# Patient Record
Sex: Male | Born: 1975 | Race: White | Hispanic: No | Marital: Single | Smoking: Current every day smoker
Health system: Southern US, Community
[De-identification: ages and names within clinical notes are randomized; demographics above are authoritative.]

## PROBLEM LIST (undated history)

## (undated) DIAGNOSIS — I10 Essential (primary) hypertension: Secondary | ICD-10-CM

## (undated) DIAGNOSIS — I471 Supraventricular tachycardia, unspecified: Secondary | ICD-10-CM

## (undated) DIAGNOSIS — R569 Unspecified convulsions: Secondary | ICD-10-CM

## (undated) DIAGNOSIS — R51 Headache: Secondary | ICD-10-CM

## (undated) HISTORY — PX: OTHER SURGICAL HISTORY: SHX169

## (undated) HISTORY — PX: ANKLE SURGERY: SHX546

---

## 1998-11-08 ENCOUNTER — Encounter: Payer: Self-pay | Admitting: Orthopedic Surgery

## 1998-11-08 ENCOUNTER — Emergency Department (HOSPITAL_COMMUNITY): Admission: EM | Admit: 1998-11-08 | Discharge: 1998-11-08 | Payer: Self-pay

## 1998-11-08 ENCOUNTER — Inpatient Hospital Stay (HOSPITAL_COMMUNITY): Admission: EM | Admit: 1998-11-08 | Discharge: 1998-11-09 | Payer: Self-pay | Admitting: Orthopedic Surgery

## 2002-07-30 ENCOUNTER — Inpatient Hospital Stay (HOSPITAL_COMMUNITY): Admission: EM | Admit: 2002-07-30 | Discharge: 2002-07-31 | Payer: Self-pay | Admitting: Emergency Medicine

## 2002-07-30 ENCOUNTER — Encounter: Payer: Self-pay | Admitting: Orthopedic Surgery

## 2002-07-30 ENCOUNTER — Encounter: Payer: Self-pay | Admitting: Emergency Medicine

## 2002-10-02 ENCOUNTER — Encounter: Payer: Self-pay | Admitting: Emergency Medicine

## 2002-10-02 ENCOUNTER — Emergency Department (HOSPITAL_COMMUNITY): Admission: EM | Admit: 2002-10-02 | Discharge: 2002-10-02 | Payer: Self-pay | Admitting: Emergency Medicine

## 2002-10-09 ENCOUNTER — Emergency Department (HOSPITAL_COMMUNITY): Admission: EM | Admit: 2002-10-09 | Discharge: 2002-10-09 | Payer: Self-pay | Admitting: Emergency Medicine

## 2003-09-08 ENCOUNTER — Emergency Department (HOSPITAL_COMMUNITY): Admission: EM | Admit: 2003-09-08 | Discharge: 2003-09-08 | Payer: Self-pay | Admitting: Emergency Medicine

## 2003-10-04 ENCOUNTER — Emergency Department (HOSPITAL_COMMUNITY): Admission: EM | Admit: 2003-10-04 | Discharge: 2003-10-04 | Payer: Self-pay | Admitting: Emergency Medicine

## 2003-10-08 ENCOUNTER — Emergency Department (HOSPITAL_COMMUNITY): Admission: EM | Admit: 2003-10-08 | Discharge: 2003-10-08 | Payer: Self-pay | Admitting: Emergency Medicine

## 2003-10-16 ENCOUNTER — Ambulatory Visit (HOSPITAL_BASED_OUTPATIENT_CLINIC_OR_DEPARTMENT_OTHER): Admission: RE | Admit: 2003-10-16 | Discharge: 2003-10-16 | Payer: Self-pay | Admitting: Otolaryngology

## 2004-03-15 ENCOUNTER — Emergency Department (HOSPITAL_COMMUNITY): Admission: EM | Admit: 2004-03-15 | Discharge: 2004-03-15 | Payer: Self-pay | Admitting: Emergency Medicine

## 2004-07-31 ENCOUNTER — Emergency Department (HOSPITAL_COMMUNITY): Admission: EM | Admit: 2004-07-31 | Discharge: 2004-07-31 | Payer: Self-pay | Admitting: Emergency Medicine

## 2004-08-08 ENCOUNTER — Emergency Department (HOSPITAL_COMMUNITY): Admission: EM | Admit: 2004-08-08 | Discharge: 2004-08-08 | Payer: Self-pay | Admitting: Emergency Medicine

## 2004-11-24 ENCOUNTER — Inpatient Hospital Stay (HOSPITAL_COMMUNITY): Admission: EM | Admit: 2004-11-24 | Discharge: 2004-11-26 | Payer: Self-pay | Admitting: Emergency Medicine

## 2004-11-24 ENCOUNTER — Ambulatory Visit: Payer: Self-pay | Admitting: Internal Medicine

## 2006-06-11 ENCOUNTER — Emergency Department (HOSPITAL_COMMUNITY): Admission: EM | Admit: 2006-06-11 | Discharge: 2006-06-11 | Payer: Self-pay | Admitting: Emergency Medicine

## 2006-06-18 ENCOUNTER — Emergency Department (HOSPITAL_COMMUNITY): Admission: EM | Admit: 2006-06-18 | Discharge: 2006-06-18 | Payer: Self-pay | Admitting: Emergency Medicine

## 2007-03-24 ENCOUNTER — Emergency Department (HOSPITAL_COMMUNITY): Admission: EM | Admit: 2007-03-24 | Discharge: 2007-03-24 | Payer: Self-pay | Admitting: Emergency Medicine

## 2007-12-04 ENCOUNTER — Emergency Department (HOSPITAL_COMMUNITY): Admission: EM | Admit: 2007-12-04 | Discharge: 2007-12-04 | Payer: Self-pay | Admitting: Emergency Medicine

## 2008-05-23 ENCOUNTER — Emergency Department (HOSPITAL_COMMUNITY): Admission: EM | Admit: 2008-05-23 | Discharge: 2008-05-23 | Payer: Self-pay | Admitting: Emergency Medicine

## 2011-03-28 NOTE — H&P (Signed)
   NAME:  KORDELL, JAFRI                           ACCOUNT NO.:  [id number]   MEDICAL RECORD NO.:  [id number]                   PATIENT TYPE:  INP   LOCATION:  5023                                 FACILITY:  [facility]   PHYSICIAN:  Kennieth Rad, M.D.              DATE OF BIRTH:  02-17-1976   DATE OF ADMISSION:  07/30/2002  DATE OF DISCHARGE:  07/31/2002                                HISTORY & PHYSICAL   CHIEF COMPLAINT:  Pain from deformed left ankle.   HISTORY OF PRESENT ILLNESS:  This is a 34 year old who states that he made a  miss step to his left heel and his foot and ankle rolled out from under him  causing him to fall.  The patient complained of severe pain and deformity of  the left ankle and was brought to the emergency room by his stepfather.  The  patient denies hitting his head or loss of consciousness.   PAST MEDICAL HISTORY:  Right knee surgery.  No history of high blood  pressure or diabetes.   ALLERGIES:  NONE.   MEDICATIONS:  None.   SOCIAL HISTORY:  The patient lives alone.   HABITS:  The patient smokes one pack per day and drinks beer on the  weekends.   REVIEW OF SYSTEMS:  No cardiac, respiratory, urine or bowel symptoms.   PHYSICAL EXAMINATION:  VITAL SIGNS:  Temperature 97.4, pulse 90,  respirations 18, blood pressure 130/80.  GENERAL:  Alert and oriented, alcohol on his breath.  The patient states he  has had 5 beers tonight.  HEAD:  Normocephalic.  Eyes, conjunctivae injected.  NECK:  Supple.  CHEST:  Clear.  CARDIAC:  S1 _________.  ABDOMEN:  Soft, active bowel sounds.  EXTREMITIES:  Left ankle diffusely swollen, angulated, unstable, tender  medially and laterally,dorsalis pedis intact, pulses intact.   X-ray reveals bimalleolar fracture of left ankle with displacement.   IMPRESSION:  Bimalleolar fracture, left ankle.                                               Kennieth Rad, M.D.    AFC/MEDQ  D:  07/30/2002  T:  08/02/2002  Job:   [id number]
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
## 2011-03-28 NOTE — Op Note (Signed)
   NAME:  Dalton [name], Dalton [name]                           ACCOUNT NO.:  [id number]   MEDICAL RECORD NO.:  [id number]                   PATIENT TYPE:  INP   LOCATION:  5023                                 FACILITY:  [facility]   PHYSICIAN:  Kennieth Rad, M.D.              DATE OF BIRTH:   DATE OF PROCEDURE:  07/30/2002  DATE OF DISCHARGE:  07/31/2002                                 OPERATIVE REPORT   PREOPERATIVE DIAGNOSIS:  Bimalleolar fracture, left ankle.   POSTOPERATIVE DIAGNOSIS:  Bimalleolar fracture, left ankle.   PROCEDURE:  Open reduction and internal fixation of bimalleolar fracture  left ankle.   SURGEON:  Kennieth Rad, M.D.   ANESTHESIA:  General.   DESCRIPTION OF PROCEDURE:  The patient was taken to the operating room after  giving adequate preop medication, general anesthesia and intubated.  The  left lower leg was prepped with Duraprep and draped in a sterile manner.  A  tourniquet and Bovie used for hemostasis.   An incision was made over the lateral aspect of the left ankle going through  the skin and subcutaneous tissue down to the fracture site.  Manipulated  reduction of the lateral malleolar fracture was done with good alignment and  was stabilized with a six-hole plate.  Copious irrigation was then done  followed by wound closure with the use of #0 Vicryl followed by skin staples  in the skin.   A medial Kocher incision made along the medial aspect of the ankle going  through the skin and subcutaneous tissue down to the fracture site.  Irrigation of the fracture site was done.  Curetment about the edges.  The  edges of the medial malleolar fracture was reduced anatomically and a  cannulated screw placed across it holding it in good stable position.  Wound  closure was then done with #0 Vicryl for the fascia, 2-0 for the  subcutaneous, skin staples for the skin.  Compressive dressing was applied  and a short leg cast applied.   The patient tolerated the  procedure quite well and was taken to the recovery  room in stable and satisfactory condition.                                               Kennieth Rad, M.D.    AFC/MEDQ  D:  07/30/2002  T:  08/02/2002  Job:  [id number]

## 2011-07-06 ENCOUNTER — Emergency Department (HOSPITAL_COMMUNITY)
Admission: EM | Admit: 2011-07-06 | Discharge: 2011-07-06 | Disposition: A | Discharge status: Home / Self Care | Payer: Self-pay | Attending: Emergency Medicine | Admitting: Emergency Medicine

## 2011-07-06 DIAGNOSIS — R221 Localized swelling, mass and lump, neck: Secondary | ICD-10-CM | POA: Insufficient documentation

## 2011-07-06 DIAGNOSIS — R220 Localized swelling, mass and lump, head: Secondary | ICD-10-CM | POA: Insufficient documentation

## 2011-07-06 DIAGNOSIS — R599 Enlarged lymph nodes, unspecified: Secondary | ICD-10-CM | POA: Insufficient documentation

## 2011-07-12 DEATH — deceased

## 2011-10-14 ENCOUNTER — Encounter: Payer: Self-pay | Admitting: *Deleted

## 2011-10-14 ENCOUNTER — Emergency Department (HOSPITAL_COMMUNITY)
Admission: EM | Admit: 2011-10-14 | Discharge: 2011-10-14 | Disposition: A | Discharge status: Home / Self Care | Payer: Self-pay | Attending: Emergency Medicine | Admitting: Emergency Medicine

## 2011-10-14 DIAGNOSIS — R221 Localized swelling, mass and lump, neck: Secondary | ICD-10-CM | POA: Insufficient documentation

## 2011-10-14 DIAGNOSIS — K1120 Sialoadenitis, unspecified: Secondary | ICD-10-CM | POA: Insufficient documentation

## 2011-10-14 DIAGNOSIS — R220 Localized swelling, mass and lump, head: Secondary | ICD-10-CM | POA: Insufficient documentation

## 2011-10-14 DIAGNOSIS — F17200 Nicotine dependence, unspecified, uncomplicated: Secondary | ICD-10-CM | POA: Insufficient documentation

## 2011-10-14 HISTORY — DX: Unspecified convulsions: R56.9

## 2011-10-14 MED ORDER — HYDROCODONE-ACETAMINOPHEN 5-325 MG PO TABS: 2.0000 | ORAL_TABLET | Freq: Four times a day (QID) | ORAL | Status: AC | PRN

## 2011-10-14 MED ORDER — CLINDAMYCIN HCL 150 MG PO CAPS: 300.0000 mg | ORAL_CAPSULE | Freq: Four times a day (QID) | ORAL | Status: AC

## 2011-10-14 NOTE — ED Notes (Signed)
Pt states "have been seen here before for the same, my face swells, hurts to eat, they say that maybe I cut m;yself shaving & it caused the infection"

## 2011-10-14 NOTE — ED Provider Notes (Signed)
History     CSN: [id number] Arrival date & time: 10/14/2011  4:05 PM   First MD Initiated Contact with Patient 10/14/11 1720      Chief Complaint  Patient presents with  . Facial Swelling    (Consider location/radiation/quality/duration/timing/severity/associated sxs/prior treatment) The history is provided by the patient.  Pt complains of swelling of the L lower jaw for the past 2 days.  Pt denies fever.  Pain is worse following eating, pt notes increased swelling following eating as well.  Pt denies dental pain, ear pain.  Pt notes abnormal taste in the mouth.  Pt notes similar symptoms 2 months ago.   Past Medical History  Diagnosis Date  . Seizures     Past Surgical History  Procedure Date  . Right knee     plate with screws  . Ankle surgery     left with screws    No family history on file.  History  Substance Use Topics  . Smoking status: Current Everyday Smoker -- 1.0 packs/day  . Smokeless tobacco: Not on file  . Alcohol Use: Yes     ocassioinally      Review of Systems  HENT: Negative for congestion, neck pain and neck stiffness.   Musculoskeletal: Negative for myalgias.  All other systems reviewed and are negative.    Allergies  Review of patient's allergies indicates no known allergies.  Home Medications  No current outpatient prescriptions on file.  BP 137/101  Pulse 107  Temp(Src) 99 F (37.2 C) (Oral)  Resp 18  Wt 250 lb (113.399 kg)  SpO2 99%  Physical Exam  Constitutional: He is oriented to person, place, and time. He appears well-developed and well-nourished.  HENT:  Head: Normocephalic and atraumatic.  Right Ear: External ear normal.  Left Ear: External ear normal.  Mouth/Throat: Oropharynx is clear and moist.       L submandibular swelling and tenderness, no erythema or warmth.  No dental tenderness, gingival swelling or fluctuance.    Eyes: Pupils are equal, round, and reactive to light.  Neck: No tracheal deviation present.  No thyromegaly present.  Pulmonary/Chest: Effort normal.  Musculoskeletal: Normal range of motion.  Lymphadenopathy:    He has no cervical adenopathy.  Neurological: He is alert and oriented to person, place, and time.  Skin: Skin is warm and dry.    ED Course  Procedures (including critical care time)  Labs Reviewed - No data to display No results found.   No diagnosis found.    MDM  Will treat as sialandenitis, ENT follow up.          Achilles Dunk Minto, [place] 10/14/11 [phone]

## 2011-10-15 ENCOUNTER — Emergency Department (HOSPITAL_COMMUNITY)
Admission: EM | Admit: 2011-10-15 | Discharge: 2011-10-16 | Disposition: A | Discharge status: Home / Self Care | Payer: Self-pay | Attending: Emergency Medicine | Admitting: Emergency Medicine

## 2011-10-15 ENCOUNTER — Encounter (HOSPITAL_COMMUNITY): Payer: Self-pay | Admitting: Emergency Medicine

## 2011-10-15 DIAGNOSIS — F17200 Nicotine dependence, unspecified, uncomplicated: Secondary | ICD-10-CM | POA: Insufficient documentation

## 2011-10-15 DIAGNOSIS — R220 Localized swelling, mass and lump, head: Secondary | ICD-10-CM | POA: Insufficient documentation

## 2011-10-15 DIAGNOSIS — R0602 Shortness of breath: Secondary | ICD-10-CM | POA: Insufficient documentation

## 2011-10-15 DIAGNOSIS — K115 Sialolithiasis: Secondary | ICD-10-CM | POA: Insufficient documentation

## 2011-10-15 DIAGNOSIS — R221 Localized swelling, mass and lump, neck: Secondary | ICD-10-CM | POA: Insufficient documentation

## 2011-10-15 NOTE — ED Notes (Signed)
Pt alert, nad, seen in ED yesterday c/o sore throat, has f/u with ENT in am, returns with cont c/o. resp even unlabored, skin pwd, no stridor noted, shows no s/s of distress, tolerating oral secretions well

## 2011-10-15 NOTE — ED Provider Notes (Signed)
Medical screening examination/treatment/procedure(s) were performed by non-physician practitioner and as supervising physician I was immediately available for consultation/collaboration.  Raeford Razor, MD 10/15/11 [phone]

## 2011-10-16 LAB — DIFFERENTIAL
Basophils Relative: 0 % (ref 0–1)
Eosinophils Absolute: 0.2 10*3/uL (ref 0.0–0.7)
Monocytes Absolute: 0.8 10*3/uL (ref 0.1–1.0)
Monocytes Relative: 8 % (ref 3–12)
Neutro Abs: 6.2 10*3/uL (ref 1.7–7.7)
Neutrophils Relative %: 60 % (ref 43–77)

## 2011-10-16 LAB — CBC
Hemoglobin: 16.6 g/dL (ref 13.0–17.0)
MCH: 32.3 pg (ref 26.0–34.0)
MCV: 91.2 fL (ref 78.0–100.0)
Platelets: 239 10*3/uL (ref 150–400)
RDW: 12.9 % (ref 11.5–15.5)
WBC: 10.3 10*3/uL (ref 4.0–10.5)

## 2011-10-16 LAB — AMYLASE: Amylase: 135 U/L — ABNORMAL HIGH (ref 0–105)

## 2011-10-16 MED ORDER — CLINDAMYCIN HCL 300 MG PO CAPS
300.0000 mg | ORAL_CAPSULE | Freq: Once | ORAL | Status: AC
  Administered 2011-10-16: 300 mg via ORAL
  Filled 2011-10-16: qty 1

## 2011-10-16 MED ORDER — FENTANYL CITRATE 0.05 MG/ML IJ SOLN
50.0000 ug | Freq: Once | INTRAMUSCULAR | Status: AC
Start: 2011-10-16 — End: 2011-10-16
  Administered 2011-10-16: 50 ug via INTRAVENOUS
  Filled 2011-10-16: qty 2

## 2011-10-16 MED ORDER — SODIUM CHLORIDE 0.9 % IV BOLUS (SEPSIS)
500.0000 mL | Freq: Once | INTRAVENOUS | Status: AC
  Administered 2011-10-16: 1000 mL via INTRAVENOUS

## 2011-10-16 NOTE — ED Notes (Signed)
Pt okay. Food and antibiotic given. Awaiting d/c instructions

## 2011-10-16 NOTE — ED Notes (Signed)
Pt d/c stable. Ambulatory.

## 2011-10-16 NOTE — ED Notes (Signed)
Pt here with c/o sore throat and difficulty swallowing. Pt was seen here yesterday and was prescribed antibiotics and pain medication. Today, he says he feels it worsened and swallowing is more difficult. Rates throat pain 5/10 at this time.

## 2011-10-16 NOTE — ED Provider Notes (Signed)
Medical screening examination/treatment/procedure(s) were performed by non-physician practitioner and as supervising physician I was immediately available for consultation/collaboration.  Zakayla Martinec M Rhylie Stehr, MD 10/16/11 0717 

## 2011-10-16 NOTE — ED Provider Notes (Signed)
History     CSN: [id number] Arrival date & time: 10/15/2011 11:21 PM   First MD Initiated Contact with Patient 10/16/11 0003      Chief Complaint  Patient presents with  . Oral Swelling    Blocked Salivary Gland    (Consider location/radiation/quality/duration/timing/severity/associated sxs/prior treatment) HPI Comments: Patient was diagnosed 2 years ago with a salivary duct stone.  Prescribed clindamycin and hydrocodone, which he, states he's been taking, but finds that the swelling has become worse and he is having increased pain after eating.  He does have an appointment with Dr. Ezzard Standing tomorrow.  No labs were done in 2 days ago for diagnosis.  Will check CBC, electrolytes, and amylase.  Patient states he will have no pain prior to eating, we'll be able to eat 2 or 3 bites and then noticed increasing pain and swelling on the left side of his neck, which now extends under his chin.  No difficulty swallowing, but does think that he had some shortness of breath with last episode.  This lasts about 30 minutes to an hour after each meal.  The history is provided by the patient.    Past Medical History  Diagnosis Date  . Seizures     Past Surgical History  Procedure Date  . Right knee     plate with screws  . Ankle surgery     left with screws    No family history on file.  History  Substance Use Topics  . Smoking status: Current Everyday Smoker -- 1.0 packs/day  . Smokeless tobacco: Not on file  . Alcohol Use: Yes     ocassioinally      Review of Systems  Constitutional: Negative for fever and chills.  HENT: Positive for facial swelling. Negative for neck pain and neck stiffness.   Eyes: Negative.   Respiratory: Positive for shortness of breath. Negative for cough.   Cardiovascular: Negative.   Gastrointestinal: Negative.   Genitourinary: Negative.   Neurological: Negative.   Hematological: Negative.   Psychiatric/Behavioral: Negative.     Allergies  Review of  patient's allergies indicates no known allergies.  Home Medications   Current Outpatient Rx  Name Route Sig Dispense Refill  . CLINDAMYCIN HCL 150 MG PO CAPS Oral Take 2 capsules (300 mg total) by mouth every 6 (six) hours. 80 capsule 0  . HYDROCODONE-ACETAMINOPHEN 5-325 MG PO TABS Oral Take 2 tablets by mouth every 6 (six) hours as needed for pain. 20 tablet 0    BP 122/90  Pulse 109  Temp(Src) 99.1 F (37.3 C) (Oral)  Resp 20  SpO2 96%  Physical Exam  Constitutional: He appears well-developed and well-nourished.  HENT:  Head: Normocephalic. No trismus in the jaw.  Mouth/Throat: Uvula is midline, oropharynx is clear and moist and mucous membranes are normal. Mucous membranes are not pale, not dry and not cyanotic. No oral lesions. No lacerations. No posterior oropharyngeal edema, posterior oropharyngeal erythema or tonsillar abscesses.       No stone/swelling seen under tongue posterior pharynx  Eyes: Pupils are equal, round, and reactive to light.  Neck: Normal carotid pulses present. No spinous process tenderness present. No edema, no erythema and normal range of motion present. No mass present.  Cardiovascular: Normal rate.   Pulmonary/Chest: Effort normal.  Abdominal: Soft.  Genitourinary: Penis normal.  Musculoskeletal: Normal range of motion.  Skin: Skin is warm and dry.  Psychiatric: He has a normal mood and affect.    ED Course  Procedures (including  critical care time)  Labs Reviewed  AMYLASE - Abnormal; Notable for the following:    Amylase 135 (*)    All other components within normal limits  CBC  DIFFERENTIAL  I-STAT, CHEM 8   No results found.   1. Salivary duct stone     Amylase elevated patient reassured that this is most likely a duct stone  MDM  Will check CBC i-STAT amylase hydrate patient provide dose of clindamycin orally feed.  Patient diffuse having pain.  Will medicate him allow him to followup with Dr. Ezzard Standing in the morning.  If amylase  negative.  Will CT scan neck to rule out abscess, although this is unlikely        Arman Filter, NP 10/16/11 1610  Arman Filter, NP 10/16/11 [phone]

## 2011-12-03 ENCOUNTER — Emergency Department (HOSPITAL_COMMUNITY)
Admission: EM | Admit: 2011-12-03 | Discharge: 2011-12-03 | Disposition: A | Discharge status: Home / Self Care | Payer: No Typology Code available for payment source | Attending: Emergency Medicine | Admitting: Emergency Medicine

## 2011-12-03 ENCOUNTER — Encounter (HOSPITAL_COMMUNITY): Payer: Self-pay | Admitting: *Deleted

## 2011-12-03 ENCOUNTER — Emergency Department (HOSPITAL_COMMUNITY): Payer: No Typology Code available for payment source

## 2011-12-03 DIAGNOSIS — R0789 Other chest pain: Secondary | ICD-10-CM | POA: Insufficient documentation

## 2011-12-03 DIAGNOSIS — R0602 Shortness of breath: Secondary | ICD-10-CM | POA: Insufficient documentation

## 2011-12-03 DIAGNOSIS — F17200 Nicotine dependence, unspecified, uncomplicated: Secondary | ICD-10-CM | POA: Insufficient documentation

## 2011-12-03 DIAGNOSIS — T1490XA Injury, unspecified, initial encounter: Secondary | ICD-10-CM | POA: Insufficient documentation

## 2011-12-03 DIAGNOSIS — IMO0001 Reserved for inherently not codable concepts without codable children: Secondary | ICD-10-CM | POA: Insufficient documentation

## 2011-12-03 DIAGNOSIS — M549 Dorsalgia, unspecified: Secondary | ICD-10-CM | POA: Insufficient documentation

## 2011-12-03 MED ORDER — DIAZEPAM 5 MG PO TABS: 5.0000 mg | ORAL_TABLET | Freq: Three times a day (TID) | ORAL | Status: AC | PRN

## 2011-12-03 MED ORDER — OXYCODONE-ACETAMINOPHEN 5-325 MG PO TABS: 1.0000 | ORAL_TABLET | Freq: Four times a day (QID) | ORAL | Status: AC | PRN

## 2011-12-03 MED ORDER — IBUPROFEN 800 MG PO TABS: 800.0000 mg | ORAL_TABLET | Freq: Three times a day (TID) | ORAL | Status: AC | PRN
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
## 2011-12-03 NOTE — ED Provider Notes (Signed)
Medical screening examination/treatment/procedure(s) were performed by non-physician practitioner and as supervising physician I was immediately available for consultation/collaboration.   Sanda Dejoy, MD 12/03/11 1541 

## 2011-12-03 NOTE — ED Notes (Signed)
Pt d/c'd home, denies questions about paperwork

## 2011-12-03 NOTE — ED Notes (Signed)
Patient transported to CT 

## 2012-03-23 ENCOUNTER — Other Ambulatory Visit (HOSPITAL_COMMUNITY): Payer: Self-pay | Admitting: Chiropractic Medicine

## 2012-03-23 DIAGNOSIS — R52 Pain, unspecified: Secondary | ICD-10-CM

## 2012-03-24 ENCOUNTER — Ambulatory Visit (HOSPITAL_COMMUNITY)
Admission: RE | Admit: 2012-03-24 | Discharge: 2012-03-24 | Disposition: A | Discharge status: Home / Self Care | Payer: No Typology Code available for payment source | Source: Ambulatory Visit | Attending: Chiropractic Medicine | Admitting: Chiropractic Medicine

## 2012-03-24 ENCOUNTER — Other Ambulatory Visit (HOSPITAL_COMMUNITY): Payer: Self-pay | Admitting: Chiropractic Medicine

## 2012-03-24 DIAGNOSIS — Z1389 Encounter for screening for other disorder: Secondary | ICD-10-CM | POA: Insufficient documentation

## 2012-03-24 DIAGNOSIS — R52 Pain, unspecified: Secondary | ICD-10-CM

## 2012-03-24 DIAGNOSIS — R29898 Other symptoms and signs involving the musculoskeletal system: Secondary | ICD-10-CM | POA: Insufficient documentation

## 2012-03-24 DIAGNOSIS — M545 Low back pain, unspecified: Secondary | ICD-10-CM | POA: Insufficient documentation

## 2012-03-24 DIAGNOSIS — M79609 Pain in unspecified limb: Secondary | ICD-10-CM | POA: Insufficient documentation

## 2012-05-12 ENCOUNTER — Emergency Department (HOSPITAL_COMMUNITY)
Admission: EM | Admit: 2012-05-12 | Discharge: 2012-05-12 | Disposition: A | Discharge status: Home / Self Care | Payer: Self-pay | Attending: Emergency Medicine | Admitting: Emergency Medicine

## 2012-05-12 ENCOUNTER — Encounter (HOSPITAL_COMMUNITY): Payer: Self-pay | Admitting: Emergency Medicine

## 2012-05-12 DIAGNOSIS — R51 Headache: Secondary | ICD-10-CM | POA: Insufficient documentation

## 2012-05-12 DIAGNOSIS — H9209 Otalgia, unspecified ear: Secondary | ICD-10-CM | POA: Insufficient documentation

## 2012-05-12 MED ORDER — HYDROCODONE-ACETAMINOPHEN 5-325 MG PO TABS: 1.0000 | ORAL_TABLET | ORAL | Status: AC | PRN

## 2012-05-12 NOTE — ED Provider Notes (Signed)
Medical screening examination/treatment/procedure(s) were performed by non-physician practitioner and as supervising physician I was immediately available for consultation/collaboration.    Vida Roller, MD 05/12/12 2352

## 2012-05-12 NOTE — ED Provider Notes (Signed)
History     CSN: [id number]  Arrival date & time 05/12/12  1503   First MD Initiated Contact with Patient 05/12/12 1557      Chief Complaint  Patient presents with  . Otalgia    right side   . Jaw Pain  . Headache    (Consider location/radiation/quality/duration/timing/severity/associated sxs/prior treatment) HPI Comments: Patient here with right sided facial pain for the past 3 weeks - he states that now the pain is excruciating and that he is unable to sleep - he states that the pain is from the tragus of the right ear and radiates down to the jaw, he states that it feels like a muscle cramp - he denies headache, blurred vision, numbness, tingling, decrease in hearing, drainage from the ear, hypersensitivity of the skin to touch, rash or blisters, nausea, vomiting, dental pain, pain with opening or closing the jaw.  Patient is a 36 y.o. male presenting with ear pain and headaches. The history is provided by the patient. No language interpreter was used.  Otalgia This is a new problem. The current episode started more than 1 week ago. There is pain in the right ear. The problem occurs constantly. The problem has not changed since onset.There has been no fever. The pain is at a severity of 9/10. Pertinent negatives include no ear discharge, no headaches, no hearing loss, no rhinorrhea, no sore throat, no abdominal pain, no diarrhea, no vomiting, no neck pain, no cough and no rash. His past medical history does not include chronic ear infection, hearing loss or tympanostomy tube.  Headache  Pertinent negatives include no vomiting.    Past Medical History  Diagnosis Date  . Seizures     10 years ago    Past Surgical History  Procedure Date  . Right knee     plate with screws  . Ankle surgery     left with screws    No family history on file.  History  Substance Use Topics  . Smoking status: Current Everyday Smoker -- 1.0 packs/day  . Smokeless tobacco: Not on file  .  Alcohol Use: Yes     ocassioinally      Review of Systems  HENT: Positive for ear pain. Negative for hearing loss, sore throat, facial swelling, rhinorrhea, neck pain and ear discharge.   Eyes: Negative for pain.  Respiratory: Negative for cough.   Gastrointestinal: Negative for vomiting, abdominal pain and diarrhea.  Genitourinary: Negative for dysuria.  Skin: Negative for rash.  Neurological: Negative for headaches.  All other systems reviewed and are negative.    Allergies  Pollen extract  Home Medications   Current Outpatient Rx  Name Route Sig Dispense Refill  . ACETAMINOPHEN 500 MG PO TABS Oral Take 1,000 mg by mouth every 4 (four) hours as needed. pain      BP 128/104  Pulse 98  Temp 98.9 F (37.2 C) (Oral)  Resp 22  SpO2 100%  Physical Exam  Nursing note and vitals reviewed. Constitutional: He is oriented to person, place, and time. He appears well-developed and well-nourished. No distress.  HENT:  Head: Normocephalic and atraumatic. No trismus in the jaw.    Right Ear: External ear normal. No drainage or swelling. Tympanic membrane is not erythematous. No middle ear effusion. No hemotympanum. No decreased hearing is noted.  Left Ear: External ear normal. No drainage or swelling. Tympanic membrane is not erythematous.  No middle ear effusion. No hemotympanum. No decreased hearing is noted.  Nose:  Nose normal. No rhinorrhea. Right sinus exhibits no maxillary sinus tenderness and no frontal sinus tenderness. Left sinus exhibits no maxillary sinus tenderness and no frontal sinus tenderness.  Mouth/Throat: Oropharynx is clear and moist and mucous membranes are normal. Normal dentition. No dental abscesses or dental caries. No oropharyngeal exudate.  Eyes: Conjunctivae and EOM are normal. Pupils are equal, round, and reactive to light. Right eye exhibits no discharge. Left eye exhibits no discharge. No scleral icterus.  Neck: Normal range of motion. Neck supple.    Cardiovascular: Normal rate, regular rhythm and normal heart sounds.  Exam reveals no gallop and no friction rub.   No murmur heard. Pulmonary/Chest: Effort normal and breath sounds normal. No respiratory distress. He has no wheezes. He has no rales. He exhibits no tenderness.  Abdominal: Soft. Bowel sounds are normal. He exhibits no distension. There is no tenderness.  Musculoskeletal: Normal range of motion. He exhibits no edema and no tenderness.  Lymphadenopathy:    He has no cervical adenopathy.  Neurological: He is alert and oriented to person, place, and time. No cranial nerve deficit. He exhibits normal muscle tone. Coordination normal.       CN I - XII intact  Skin: Skin is warm and dry. No rash noted. No erythema. No pallor.  Psychiatric: He has a normal mood and affect. His behavior is normal. Judgment and thought content normal.    ED Course  Procedures (including critical care time)  Labs Reviewed - No data to display No results found.   Right sided facial pain   MDM  Patient here with three week history of right sided facial pain - there is no evidence of CVA, TMJ, ear effusion or infection, dental caries or infection, I also do not suspect Bell's Palsy, trigeminal neuralgia, HSV infection, sinusitis or parotiditis.  I am unsure the cause of the pain and will place the patient on pain medication and refer to ENT, I doubt imaging will help me illicit the cause as well.        Izola Price [place], [place] 05/12/12 1641

## 2012-05-12 NOTE — ED Notes (Addendum)
Pain in jaw, ear and head started 3 weeks ago and intense. Pt has hx of sinusitis from Nov 2012 to January 2013.  Pt stated that half of face, right side, react slower than other side. No swelling visible. Pt has hx of dental pain.  Pt has redness in right eye.

## 2012-05-17 ENCOUNTER — Emergency Department (HOSPITAL_COMMUNITY)
Admission: EM | Admit: 2012-05-17 | Discharge: 2012-05-17 | Disposition: A | Discharge status: Home / Self Care | Payer: Self-pay | Attending: Emergency Medicine | Admitting: Emergency Medicine

## 2012-05-17 ENCOUNTER — Emergency Department (HOSPITAL_COMMUNITY): Payer: Self-pay

## 2012-05-17 ENCOUNTER — Encounter (HOSPITAL_COMMUNITY): Payer: Self-pay | Admitting: *Deleted

## 2012-05-17 DIAGNOSIS — F17200 Nicotine dependence, unspecified, uncomplicated: Secondary | ICD-10-CM | POA: Insufficient documentation

## 2012-05-17 DIAGNOSIS — R6884 Jaw pain: Secondary | ICD-10-CM | POA: Insufficient documentation

## 2012-05-17 DIAGNOSIS — R209 Unspecified disturbances of skin sensation: Secondary | ICD-10-CM | POA: Insufficient documentation

## 2012-05-17 DIAGNOSIS — R51 Headache: Secondary | ICD-10-CM | POA: Insufficient documentation

## 2012-05-17 MED ORDER — IBUPROFEN 600 MG PO TABS: 600.0000 mg | ORAL_TABLET | Freq: Three times a day (TID) | ORAL | Status: AC | PRN

## 2012-05-17 MED ORDER — HYDROCODONE-ACETAMINOPHEN 5-500 MG PO TABS: 1.0000 | ORAL_TABLET | Freq: Four times a day (QID) | ORAL | Status: AC | PRN

## 2012-05-17 NOTE — ED Provider Notes (Addendum)
History   This chart was scribed for Suzi Roots, MD by Sofie Rower. The patient was seen in room APA08/APA08 and the patient's care was started at 1:44 PM     CSN: [id number]  Arrival date & time 05/17/12  1312   First MD Initiated Contact with Patient 05/17/12 1342      Chief Complaint  Patient presents with  . Jaw Pain    (Consider location/radiation/quality/duration/timing/severity/associated sxs/prior treatment) The history is provided by the patient.    Dalton [name] is a 36 y.o. male who presents to the Emergency Department complaining of moderate, episodic jaw pain located at the right side of the jaw onset three weeks ago with associated symptoms of loss of sleep, radiating ear pain located at the right ear, shakiness located at the right arm and right hand. The pt informs the EDP that the jaw pain comes in phases. The pt reports the episode usually lasts around an hour. Modifying factors include swallowing or drinking which provides moderate relief.  Pt notes daily right face/jaw/cheek pain for past 3+ weeks. Occasionally improved with eating/chewing. No rash/swelling/redness to area. No trauma. No ear pain, hearing loss or tinnitus. No eye pain or change in vision. No tooth pain. No throat pain. No neck pain. Denies headache. No hx cluster headache, migraines, tmj, shingles to area, or trigeminal neuralgia. No acute or abrupt worsening today. Was previously referred to ent for follow up but hasnt seen yet. States occasionally will feel twitchy/shaky in right hand, pt states unsure if rxn to pain as seems associated with the right facial pain, lasts seconds per episode. No extremity or facial loss of sensation or weakness. No impaired dexterity or use of hand/fingers. Denies any chest pain or discomfort. No unusual doe, sob or diaphoresis. Symptoms occur at rest. Denies any symptoms w exertion.   Pt denies headaches, drainage, similar symptoms in the past.   Pt has not been to visit  the ENT.      Past Medical History  Diagnosis Date  . Seizures     10 years ago    Past Surgical History  Procedure Date  . Right knee     plate with screws  . Ankle surgery     left with screws    History reviewed. No pertinent family history.  History  Substance Use Topics  . Smoking status: Current Everyday Smoker -- 1.0 packs/day  . Smokeless tobacco: Not on file  . Alcohol Use: Yes     ocassioinally      Review of Systems  Constitutional: Negative for fever and chills.  HENT: Negative for hearing loss, neck pain, neck stiffness and tinnitus.   Eyes: Negative for pain, redness and visual disturbance.  Respiratory: Negative for cough and shortness of breath.   Cardiovascular: Negative for chest pain.  Gastrointestinal: Negative for abdominal pain.  Genitourinary: Negative for flank pain.  Musculoskeletal: Negative for back pain.  Skin: Negative for rash.  Neurological: Negative for weakness and headaches.  Hematological: Does not bruise/bleed easily.  Psychiatric/Behavioral: Negative for confusion.  All other systems reviewed and are negative.      Allergies  Pollen extract  Home Medications   Current Outpatient Rx  Name Route Sig Dispense Refill  . HYDROCODONE-ACETAMINOPHEN 5-325 MG PO TABS Oral Take 1 tablet by mouth every 4 (four) hours as needed for pain. 20 tablet 0  . FISH OIL 500 MG PO CAPS Oral Take 500 mg by mouth daily.  BP 149/101  Pulse 94  Temp 98 F (36.7 C) (Oral)  Resp 20  Ht 5\' 10"  (1.778 m)  Wt 260 lb (117.935 kg)  BMI 37.31 kg/m2  SpO2 99%  Physical Exam  Nursing note and vitals reviewed. Constitutional: He is oriented to person, place, and time. He appears well-developed and well-nourished. No distress.  HENT:  Head: Atraumatic.  Right Ear: External ear normal.  Left Ear: External ear normal.  Nose: Nose normal.  Mouth/Throat: Oropharynx is clear and moist.       No tmj click. No malocclusion. No reproduction  symptoms or tenderness w palp at angle of jaw, or about face. No facial swelling or redness. No skin changes, lesions or rash. No dental tenderness or gross decay. Gum swelling or tenderness. No focal temporal or trigeminal tenderness.   Eyes: Conjunctivae and EOM are normal. Pupils are equal, round, and reactive to light.  Neck: Normal range of motion. Neck supple. No tracheal deviation present.  Cardiovascular: Normal rate, regular rhythm and normal heart sounds.   Pulmonary/Chest: Effort normal and breath sounds normal. No accessory muscle usage. No respiratory distress.  Abdominal: He exhibits no distension.  Musculoskeletal: Normal range of motion. He exhibits no edema and no tenderness.  Lymphadenopathy:    He has no cervical adenopathy.  Neurological: He is alert and oriented to person, place, and time.       Motor intact bil. Steady gait.   Skin: Skin is warm and dry.  Psychiatric: He has a normal mood and affect. His behavior is normal.    ED Course  Procedures (including critical care time)  DIAGNOSTIC STUDIES: Oxygen Saturation is 99% on room air, normal by my interpretation.    COORDINATION OF CARE:  1:48PM- EDP at bedside discusses treatment plan concerning possibility of nerve pain, scan.   Ct Head Wo Contrast  05/17/2012  *RADIOLOGY REPORT*  Clinical Data: Right arm parasthesias, right facial pain, headache  CT HEAD WITHOUT CONTRAST  Technique:  Contiguous axial images were obtained from the base of the skull through the vertex without contrast.  Comparison: 11/24/2004  Findings: Right frontal scalp soft tissue injury versus scarring. This was present in 2006.  No acute intracranial hemorrhage, mass lesion, infarction, midline shift, herniation, hydrocephalus, or extra-axial fluid collection.  Gray-white matter differentiation maintained.  Cisterns patent.  No cerebellar abnormality.  Mastoids and sinuses clear.  IMPRESSION: Stable exam.  No acute intracranial process   Original Report Authenticated By: Judie Petit. Ruel Favors, M.D.      MDM  I personally performed the services described in this documentation, which was scribed in my presence. The recorded information has been reviewed and considered. Suzi Roots, MD   Ct neg. Exam unremarkable. Discussed need pcp follow up.   Pt currently comfortable. Notes facial pain occ improved w swallowing/eating. No electric shock like or burning pain. Not associating w any trigger such as touching face, eating, facial movements, etc.  Discussed diff dx incl tmj, trigeminal neuralgia, etc, however based on hx/exam feel less likely.    Suzi Roots, MD 05/17/12 1458  Suzi Roots, MD 05/17/12 [phone]

## 2012-05-17 NOTE — ED Notes (Signed)
Pain rt side of face for 3 weeks, Has been seen by dentist and said no dental caries causing this.  Has shaking of rt  Arm and hand at times.

## 2012-07-09 ENCOUNTER — Emergency Department (HOSPITAL_COMMUNITY)
Admission: EM | Admit: 2012-07-09 | Discharge: 2012-07-09 | Disposition: A | Discharge status: Home / Self Care | Payer: Self-pay | Attending: Emergency Medicine | Admitting: Emergency Medicine

## 2012-07-09 ENCOUNTER — Encounter (HOSPITAL_COMMUNITY): Payer: Self-pay

## 2012-07-09 DIAGNOSIS — H5789 Other specified disorders of eye and adnexa: Secondary | ICD-10-CM

## 2012-07-09 DIAGNOSIS — H02849 Edema of unspecified eye, unspecified eyelid: Secondary | ICD-10-CM | POA: Insufficient documentation

## 2012-07-09 DIAGNOSIS — F17200 Nicotine dependence, unspecified, uncomplicated: Secondary | ICD-10-CM | POA: Insufficient documentation

## 2012-07-09 DIAGNOSIS — H0259 Other disorders affecting eyelid function: Secondary | ICD-10-CM

## 2012-07-09 MED ORDER — FLUORESCEIN SODIUM 1 MG OP STRP
1.0000 | ORAL_STRIP | Freq: Once | OPHTHALMIC | Status: AC
  Administered 2012-07-09: 1 via OPHTHALMIC
  Filled 2012-07-09: qty 1

## 2012-07-09 MED ORDER — TETRACAINE HCL 0.5 % OP SOLN
1.0000 [drp] | Freq: Once | OPHTHALMIC | Status: AC
  Administered 2012-07-09: 1 [drp] via OPHTHALMIC
  Filled 2012-07-09: qty 2

## 2012-07-09 MED ORDER — NAPHAZOLINE-PHENIRAMINE 0.025-0.3 % OP SOLN: 1.0000 [drp] | OPHTHALMIC | Status: AC | PRN

## 2012-07-09 MED ORDER — CEPHALEXIN 500 MG PO CAPS: 500.0000 mg | ORAL_CAPSULE | Freq: Four times a day (QID) | ORAL | Status: AC

## 2012-07-09 NOTE — ED Notes (Addendum)
Pt works w/machinery.  He had grease on fingers and wiped his eye w/finger on Tues (3 days ago).  Since then has been getting worse.  States he has been doing saline flushes.  C/O pain, esp when looking different directions.  Denies any visual impairment.  There is visible redness and swelling to eyelid.  States there was drainage this am.

## 2012-07-09 NOTE — ED Provider Notes (Signed)
History     CSN: [id number]  Arrival date & time 07/09/12  1352   First MD Initiated Contact with Patient 07/09/12 1505      Chief Complaint  Patient presents with  . Eye Pain    (Consider location/radiation/quality/duration/timing/severity/associated sxs/prior treatment) HPI Comments: Pt to ER w 3 days of right eye irritation. Onset after rubbing his eye with grease on his knuckle. Pain severity is 4/10. Associated s/s include FB sensation, eye pruritis & upper lid swelling. Pt does not wear contacts. Tdap is up to date.  Denies change in vision, pain w EOMs,fever nights sweats chills, or HA.   Patient is a 36 y.o. male presenting with eye pain. The history is provided by the patient.  Eye Pain This is a new problem. Episode onset: 3 days ago  The problem occurs constantly. The problem has been unchanged. Treatments tried: eye irrigation. The treatment provided no relief.    Past Medical History  Diagnosis Date  . Seizures     10 years ago    Past Surgical History  Procedure Date  . Right knee     plate with screws  . Ankle surgery     left with screws    History reviewed. No pertinent family history.  History  Substance Use Topics  . Smoking status: Current Everyday Smoker -- 1.0 packs/day  . Smokeless tobacco: Not on file  . Alcohol Use: Yes     ocassioinally      Review of Systems  Eyes: Positive for pain, discharge (tearing daily, AM crusting ), redness and itching. Negative for photophobia and visual disturbance.  All other systems reviewed and are negative.    Allergies  Pollen extract  Home Medications   Current Outpatient Rx  Name Route Sig Dispense Refill  . FISH OIL 500 MG PO CAPS Oral Take 500 mg by mouth daily.      BP 132/95  Pulse 98  Temp 98.7 F (37.1 C) (Oral)  Resp 18  SpO2 98%  Physical Exam  Nursing note and vitals reviewed. Constitutional: He is oriented to person, place, and time. He appears well-developed and  well-nourished. No distress.  HENT:  Head: Normocephalic and atraumatic.  Eyes: Conjunctivae and EOM are normal. Pupils are equal, round, and reactive to light. No foreign body present in the left eye.       No tenderness to palpation over temporal arteries or orbital region. Pain free EOMs, visual acuity equal bilaterally, pH 7 bilaterally, no proptosis,, hyphema, purulent discharge from eyes, or consensual photophobia.  Mild upper lid swelling Eyelids everted, no evidence of FB.  Fluorescein study: no corneal uptake, dendritic pattern, or evidence of corneal abrasions.  Neck: Normal range of motion. Neck supple.  Pulmonary/Chest: Effort normal.  Neurological: He is alert and oriented to person, place, and time.  Skin: Skin is warm and dry. No rash noted. He is not diaphoretic.  Psychiatric: His behavior is normal.    ED Course  Procedures (including critical care time)  Labs Reviewed - No data to display No results found.   No diagnosis found.    MDM   Pt to ER with mild upper lid edema & erythema, normal EOMs & visual acuity, no corneal fluorescein uptake. DC w oral cephalosporin and naphazoline. Strict return precautions discussed as well as reasons to call the opthalmologic. F-u w eye doctor in 3 days if symptoms persist         Jaci Carrel, [place] 07/09/12 1624

## 2012-07-10 NOTE — ED Provider Notes (Signed)
Medical screening examination/treatment/procedure(s) were performed by non-physician practitioner and as supervising physician I was immediately available for consultation/collaboration.  Sherlin Sonier, MD 07/10/12 0012 

## 2013-04-11 ENCOUNTER — Encounter (HOSPITAL_COMMUNITY): Payer: Self-pay | Admitting: *Deleted

## 2013-04-11 ENCOUNTER — Emergency Department (HOSPITAL_COMMUNITY)
Admission: EM | Admit: 2013-04-11 | Discharge: 2013-04-12 | Disposition: A | Discharge status: Home / Self Care | Payer: Self-pay | Attending: Emergency Medicine | Admitting: Emergency Medicine

## 2013-04-11 ENCOUNTER — Emergency Department (HOSPITAL_COMMUNITY): Payer: Self-pay

## 2013-04-11 DIAGNOSIS — M25511 Pain in right shoulder: Secondary | ICD-10-CM

## 2013-04-11 DIAGNOSIS — Z8669 Personal history of other diseases of the nervous system and sense organs: Secondary | ICD-10-CM | POA: Insufficient documentation

## 2013-04-11 DIAGNOSIS — F17200 Nicotine dependence, unspecified, uncomplicated: Secondary | ICD-10-CM | POA: Insufficient documentation

## 2013-04-11 DIAGNOSIS — M25519 Pain in unspecified shoulder: Secondary | ICD-10-CM | POA: Insufficient documentation

## 2013-04-11 NOTE — ED Notes (Signed)
Rt shoulder pain since moving a heavy chair.Increased pain with movement.

## 2013-04-12 MED ORDER — HYDROCODONE-ACETAMINOPHEN 5-325 MG PO TABS
1.0000 | ORAL_TABLET | Freq: Once | ORAL | Status: AC
  Administered 2013-04-12: 1 via ORAL
  Filled 2013-04-12: qty 1

## 2013-04-12 MED ORDER — HYDROCODONE-ACETAMINOPHEN 5-325 MG PO TABS: 1.0000 | ORAL_TABLET | ORAL | Status: DC | PRN

## 2013-04-12 MED ORDER — IBUPROFEN 800 MG PO TABS
800.0000 mg | ORAL_TABLET | Freq: Once | ORAL | Status: AC
  Administered 2013-04-12: 800 mg via ORAL
  Filled 2013-04-12: qty 1

## 2013-04-12 NOTE — ED Notes (Signed)
Discharge instructions given and reviewed with patient.  Prescription given for Vicodin; effects and use explained.  Patient verbalized understanding of sedating effects of Vicodin and to alternate heat and ice on right shoulder.  Patient ambulatory; discharged home in good condition.

## 2013-04-12 NOTE — ED Provider Notes (Signed)
History     CSN: [id number]  Arrival date & time 04/11/13  2104   First MD Initiated Contact with Patient 04/12/13 0119      Chief Complaint  Patient presents with  . Shoulder Pain    (Consider location/radiation/quality/duration/timing/severity/associated sxs/prior treatment) HPI HPI Comments: Dalton [name] is a 37 y.o. male who presents to the Emergency Department complaining of pain to the right shoulder for over a week when he helped his boss move a recliner. Pain to the shoulder has been constant and has affected his ability to work. He has used a prescription given him for his legs which has not helped.   Past Medical History  Diagnosis Date  . Seizures     10 years ago    Past Surgical History  Procedure Laterality Date  . Right knee      plate with screws  . Ankle surgery      left with screws    History reviewed. No pertinent family history.  History  Substance Use Topics  . Smoking status: Current Every Day Smoker -- 1.00 packs/day  . Smokeless tobacco: Not on file  . Alcohol Use: Yes     Comment: ocassioinally      Review of Systems  Constitutional: Negative for fever.       10 Systems reviewed and are negative for acute change except as noted in the HPI.  HENT: Negative for congestion.   Eyes: Negative for discharge and redness.  Respiratory: Negative for cough and shortness of breath.   Cardiovascular: Negative for chest pain.  Gastrointestinal: Negative for vomiting and abdominal pain.  Musculoskeletal: Negative for back pain.       Right shoulder pain  Skin: Negative for rash.  Neurological: Negative for syncope, numbness and headaches.  Psychiatric/Behavioral:       No behavior change.    Allergies  Pollen extract  Home Medications   Current Outpatient Rx  Name  Route  Sig  Dispense  Refill  . Omega-3 Fatty Acids (FISH OIL) 500 MG CAPS   Oral   Take 500 mg by mouth daily.           BP 119/82  Pulse 106  Temp(Src) 98.4 F  (36.9 C) (Oral)  Resp 20  Ht 5\' 10"  (1.778 m)  Wt 260 lb (117.935 kg)  BMI 37.31 kg/m2  SpO2 100%  Physical Exam  Nursing note and vitals reviewed. Constitutional:  Awake, alert, nontoxic appearance.  HENT:  Head: Atraumatic.  Eyes: Right eye exhibits no discharge. Left eye exhibits no discharge.  Neck: Neck supple.  Pulmonary/Chest: Effort normal. He exhibits no tenderness.  Abdominal: Soft. There is no tenderness. There is no rebound.  Musculoskeletal: He exhibits no tenderness.  Baseline ROM, no obvious new focal weakness.Right shoulder with FROM, no crepitus. Mild tenderness over the deltoid to palpation  Neurological:  Mental status and motor strength appears baseline for patient and situation.  Skin: No rash noted.  Psychiatric: He has a normal mood and affect.    ED Course  Procedures (including critical care time)  Labs Reviewed - No data to display Dg Shoulder Right  04/11/2013   *RADIOLOGY REPORT*  Clinical Data: Right shoulder pain  RIGHT SHOULDER - 2+ VIEW  Comparison: None.  Findings: No fracture or dislocation.  No soft tissue abnormality. No radiopaque foreign body.  Right lung apex is clear in its visualized aspects.  AC joint distance is normal.  IMPRESSION: No acute osseous abnormality.   Original  Report Authenticated By: Christiana Pellant, M.D.        MDM  Patient presents with right shoulder tenderness. Given hydrocodone and ibuprofen for pain. Pt stable in ED with no significant deterioration in condition.The patient appears reasonably screened and/or stabilized for discharge and I doubt any other medical condition or other [facility] [address] requiring further screening, evaluation, or treatment in the ED at this time prior to discharge.  MDM Reviewed: nursing note and vitals Interpretation: x-ray           Nicoletta Dress. Colon Branch, MD 04/12/13 [phone]

## 2013-08-15 ENCOUNTER — Inpatient Hospital Stay (HOSPITAL_COMMUNITY)
Admission: EM | Admit: 2013-08-15 | Discharge: 2013-08-18 | DRG: 871 | Disposition: A | Discharge status: Home / Self Care | Payer: Self-pay | Attending: Internal Medicine | Admitting: Internal Medicine

## 2013-08-15 ENCOUNTER — Emergency Department (HOSPITAL_COMMUNITY): Payer: No Typology Code available for payment source

## 2013-08-15 ENCOUNTER — Encounter (HOSPITAL_COMMUNITY): Payer: Self-pay

## 2013-08-15 ENCOUNTER — Emergency Department (HOSPITAL_COMMUNITY): Payer: Self-pay

## 2013-08-15 DIAGNOSIS — F17200 Nicotine dependence, unspecified, uncomplicated: Secondary | ICD-10-CM

## 2013-08-15 DIAGNOSIS — Z720 Tobacco use: Secondary | ICD-10-CM

## 2013-08-15 DIAGNOSIS — R509 Fever, unspecified: Secondary | ICD-10-CM

## 2013-08-15 DIAGNOSIS — F1210 Cannabis abuse, uncomplicated: Secondary | ICD-10-CM | POA: Diagnosis present

## 2013-08-15 DIAGNOSIS — A419 Sepsis, unspecified organism: Principal | ICD-10-CM

## 2013-08-15 DIAGNOSIS — E860 Dehydration: Secondary | ICD-10-CM | POA: Diagnosis present

## 2013-08-15 DIAGNOSIS — J189 Pneumonia, unspecified organism: Secondary | ICD-10-CM

## 2013-08-15 DIAGNOSIS — R197 Diarrhea, unspecified: Secondary | ICD-10-CM

## 2013-08-15 HISTORY — DX: Headache: R51

## 2013-08-15 LAB — SALICYLATE LEVEL: Salicylate Lvl: 2.2 mg/dL — ABNORMAL LOW (ref 2.8–20.0)

## 2013-08-15 LAB — CBC WITH DIFFERENTIAL/PLATELET
Basophils Relative: 0 % (ref 0–1)
Eosinophils Absolute: 0.1 10*3/uL (ref 0.0–0.7)
Hemoglobin: 16.6 g/dL (ref 13.0–17.0)
MCH: 32.7 pg (ref 26.0–34.0)
MCHC: 35.9 g/dL (ref 30.0–36.0)
Monocytes Relative: 10 % (ref 3–12)
Neutrophils Relative %: 70 % (ref 43–77)
RDW: 13.2 % (ref 11.5–15.5)

## 2013-08-15 LAB — ACETAMINOPHEN LEVEL: Acetaminophen (Tylenol), Serum: <15 ug/mL (ref 10–30)

## 2013-08-15 LAB — POCT I-STAT TROPONIN I

## 2013-08-15 LAB — COMPREHENSIVE METABOLIC PANEL
Albumin: 3.7 g/dL (ref 3.5–5.2)
BUN: 16 mg/dL (ref 6–23)
Creatinine, Ser: 1.21 mg/dL (ref 0.50–1.35)
Potassium: 4.3 mEq/L (ref 3.5–5.1)
Total Protein: 7.8 g/dL (ref 6.0–8.3)

## 2013-08-15 LAB — CG4 I-STAT (LACTIC ACID): Lactic Acid, Venous: 1.87 mmol/L (ref 0.5–2.2)

## 2013-08-15 MED ORDER — IOHEXOL 350 MG/ML SOLN
100.0000 mL | Freq: Once | INTRAVENOUS | Status: AC | PRN
  Administered 2013-08-15: 100 mL via INTRAVENOUS

## 2013-08-15 MED ORDER — AZITHROMYCIN 250 MG PO TABS
500.0000 mg | ORAL_TABLET | Freq: Once | ORAL | Status: AC
Start: 2013-08-15 — End: 2013-08-15
  Administered 2013-08-15: 500 mg via ORAL
  Filled 2013-08-15: qty 2

## 2013-08-15 MED ORDER — ACETAMINOPHEN 325 MG PO TABS
650.0000 mg | ORAL_TABLET | Freq: Once | ORAL | Status: AC
  Administered 2013-08-15: 650 mg via ORAL
  Filled 2013-08-15: qty 2

## 2013-08-15 MED ORDER — DEXTROSE 5 % IV SOLN
1.0000 g | Freq: Once | INTRAVENOUS | Status: AC
  Administered 2013-08-15: 1 g via INTRAVENOUS
  Filled 2013-08-15: qty 10

## 2013-08-15 MED ORDER — SODIUM CHLORIDE 0.9 % IV SOLN
Freq: Once | INTRAVENOUS | Status: AC
  Administered 2013-08-15: 21:00:00 via INTRAVENOUS

## 2013-08-15 NOTE — H&P (Signed)
PCP: No PCP Per Patient    Chief Complaint:  Shortness of breath  HPI: Dalton [name] is a 37 y.o. male   has a past medical history of Seizures.   Presented with  4 day history of cough, fever up to 103 and fatigue. He was exposed to coworker with similar illness.  Today he became severely short of breath and called EMS. He was found to be severely tachycardic and was brought in to ER. After getting IVF he started to do better. He is feeling much better now. He has been up and ambulating without much trouble not on oxygen at this time.  Denies HIV risk factors.  Review of Systems:    Pertinent positives include: Fevers, chills, shortness of breath at rest non-productive cough  Constitutional:  No weight loss, night sweats,fatigue, weight loss  HEENT:  No headaches, Difficulty swallowing,Tooth/dental problems,Sore throat,  No sneezing, itching, ear ache, nasal congestion, post nasal drip,  Cardio-vascular:  No chest pain, Orthopnea, PND, anasarca, dizziness, palpitations.no Bilateral lower extremity swelling  GI:  No heartburn, indigestion, abdominal pain, nausea, vomiting, diarrhea, change in bowel habits, loss of appetite, melena, blood in stool, hematemesis Resp:  no . No dyspnea on exertion, No excess mucus, no productive cough, No No coughing up of blood.No change in color of mucus.No wheezing. Skin:  no rash or lesions. No jaundice GU:  no dysuria, change in color of urine, no urgency or frequency. No straining to urinate.  No flank pain.  Musculoskeletal:  No joint pain or no joint swelling. No decreased range of motion. No back pain.  Psych:  No change in mood or affect. No depression or anxiety. No memory loss.  Neuro: no localizing neurological complaints, no tingling, no weakness, no double vision, no gait abnormality, no slurred speech, no confusion  Otherwise ROS are negative except for above, 10 systems were reviewed  Past Medical History: Past Medical History   Diagnosis Date  . Seizures     10 years ago   Past Surgical History  Procedure Laterality Date  . Right knee      plate with screws  . Ankle surgery      left with screws     Medications: Prior to Admission medications   Medication Sig Start Date End Date Taking? Authorizing Provider  acetaminophen (TYLENOL) 500 MG tablet Take 1,000 mg by mouth every 6 (six) hours as needed for fever.   Yes Historical Provider, MD  ibuprofen (ADVIL,MOTRIN) 200 MG tablet Take 800 mg by mouth every 6 (six) hours as needed for fever.   Yes Historical Provider, MD  OVER THE COUNTER MEDICATION Take 20 mLs by mouth at bedtime as needed (cough, fever, congestion). [facility] cough/cold medicine   Yes Historical Provider, MD    Allergies:  No Known Allergies  Social History:  Ambulatory independently  Lives at home with Wife and 70 yo daughter.    reports that he has been smoking.  He does not have any smokeless tobacco history on file. He reports that  drinks alcohol. He reports that he uses illicit drugs (Marijuana).   Family History: family history includes Diabetes in his father.    Physical Exam: Patient Vitals for the past 24 hrs:  BP Temp Temp src Pulse Resp SpO2 Height Weight  08/15/13 2225 106/64 mmHg 99.1 F (37.3 C) Oral 105 17 99 % - -  08/15/13 1933 129/80 mmHg 100.9 F (38.3 C) Oral 120 19 100 % - -  08/15/13 1845  122/85 mmHg - - 116 - 98 % - -  08/15/13 1830 127/82 mmHg - - 115 - 98 % - -  08/15/13 1730 93/36 mmHg - - 129 11 93 % - -  08/15/13 1700 105/75 mmHg - - 135 15 95 % - -  08/15/13 1657 135/0 mmHg 100.6 F (38.1 C) Oral 134 24 97 % 5\' 10"  (1.778 m) 122.471 kg (270 lb)    1. General:  in No Acute distress 2. Psychological: Alert and   Oriented 3. Head/ENT:   Moist  Mucous Membranes                          Head Non traumatic, neck supple                          Normal   Dentition 4. SKIN: normal   Skin turgor,  Skin clean Dry and intact no rash 5. Heart: rapid but  Regular rate and rhythm no Murmur, Rub or gallop 6. Lungs: Clear to auscultation bilaterally, no wheezes or crackles   7. Abdomen: Soft, non-tender, Non distended 8. Lower extremities: no clubbing, cyanosis, or edema 9. Neurologically Grossly intact, moving all 4 extremities equally 10. MSK: Normal range of motion  body mass index is 38.74 kg/(m^2).   Labs on Admission:   Recent Labs  08/15/13 1700  NA 135  K 4.3  CL 99  CO2 23  GLUCOSE 136*  BUN 16  CREATININE 1.21  CALCIUM 9.0    Recent Labs  08/15/13 1700  AST 27  ALT 19  ALKPHOS 61  BILITOT 0.2*  PROT 7.8  ALBUMIN 3.7   No results found for this basename: LIPASE, AMYLASE,  in the last 72 hours  Recent Labs  08/15/13 1700  WBC 12.6*  NEUTROABS 8.8*  HGB 16.6  HCT 46.3  MCV 91.3  PLT 210   No results found for this basename: CKTOTAL, CKMB, CKMBINDEX, TROPONINI,  in the last 72 hours  Recent Labs  08/15/13 1700  TSH 1.894   No results found for this basename: VITAMINB12, FOLATE, FERRITIN, TIBC, IRON, RETICCTPCT,  in the last 72 hours No results found for this basename: HGBA1C    Estimated Creatinine Clearance: 110.8 ml/min (by C-G formula based on Cr of 1.21). ABG No results found for this basename: phart, pco2, po2, hco3, tco2, acidbasedef, o2sat     No results found for this basename: DDIMER     Other results:  I have pearsonaly reviewed this: ECG REPORT  Rate: 113   Rhythm: ST ST&T Change: no ischemic changes   Cultures: No results found for this basename: sdes, specrequest, cult, reptstatus       Radiological Exams on Admission: Dg Chest 2 View  08/15/2013   ADDENDUM REPORT: 08/15/2013 18:35  ADDENDUM: The CLINICAL DATA provided in the original report is incorrect. Please use the following:  CLINICAL DATA: CLINICAL DATA  Fever, cough and congestion with mid chest pain and shortness of breath.   Electronically Signed   By: Leanna Battles M.D.   On: 08/15/2013 18:35   08/15/2013    CLINICAL DATA:  Chest pain and back pain with shortness of breath. Elevated troponin and D-dimer levels.  EXAM: CHEST  2 VIEW  COMPARISON:  12/03/2011.  FINDINGS: Trachea is midline. Heart size normal. Possible added density in the right perihilar region, not visualized on the prior exam. Lungs are otherwise clear. No  pleural fluid. Mild anterior wedging of mid and lower thoracic vertebral bodies appears unchanged.  IMPRESSION: Possible added density in the right perihilar region. CT chest without contrast could be performed in further evaluation. If there is concern for pulmonary embolus, CTA chest with contrast is the preferred examination.  Electronically Signed: By: Leanna Battles M.D. On: 08/15/2013 18:30   Ct Angio Chest Pe W/cm &/or Wo Cm  08/15/2013   *RADIOLOGY REPORT*  Clinical Data: Chest pain, palpitations evaluate for PE  CT ANGIOGRAPHY CHEST  Technique:  Multidetector CT imaging of the chest using the standard protocol during bolus administration of intravenous contrast. Multiplanar reconstructed images including MIPs were obtained and reviewed to evaluate the vascular anatomy.  Contrast: OMNIPAQUE IOHEXOL 350 MG/ML SOLN  Comparison: Chest x-ray obtained earlier today at 1800 hours  Findings:  Mediastinum: Residual thymic tissue in the anterior mediastinum. Nonspecific, not enlarged by CT criteria mediastinal and hilar lymph nodes.  Index low right paratracheal node measures 8 mm in short axis.  Probable small hiatal hernia.  Heart/Vascular: Adequate opacification of the pulmonary arteries to the segmental level.  Evaluation the more distal arteries limited secondary to contrast bolus timing and respiratory motion.  No central filling defect to suggest acute pulmonary embolus. Conventional three-vessel arch anatomy.  Atherosclerotic vascular calcifications noted along the left anterior descending coronary artery.  More detail region of coronary calcium is limited by non gated technique. Heart  itself is within normal limits for size.  No pericardial effusion. The ascending thoracic aorta is ectatic at 4.1 cm.  Lungs/Pleura: Multi focal patchy areas of ground-glass attenuation airspace in a predominately bronchovascular distribution throughout the bilateral upper and to a lesser extent the lower lobes.  Trace dependent atelectasis in the lower lobes.  No pleural effusion.  Upper Abdomen: Diffuse low attenuation of the hepatic parenchyma suggests advanced hepatic steatosis.  Otherwise, limited imaging of the upper abdomen is unremarkable.  Bones: No acute fracture or aggressive appearing lytic or blastic osseous lesion.  IMPRESSION:  1.  Negative for acute pulmonary embolus to the segmental level. 2.  Multi-focal ground-glass attenuation opacities throughout both upper and, to a lesser extent, lower lobes consistent with an acute infectious or inflammatory process.  Atypical and viral infection should be considered given the distribution.  Alveolitis is another possibility.  3.  Early onset coronary artery disease as defined by the presence of calcified atherosclerotic plaque along the course of the left anterior descending coronary artery.  Recommend further clinical evaluation and risk factor stratification with initiation of dietary, lifestyle and pharmacologic modifications if clinically warranted.  4.  Ectatic ascending thoracic aorta measures up to 4.1 cm.  Query clinical history of systemic hypertension, or bicuspid aortic valve.  5.  Mediastinal lymph nodes are likely reactive.  6.  Advanced hepatic steatosis.   Original Report Authenticated By: Malachy Moan, M.D.    Chart has been reviewed  Assessment/Plan  37 yo with CAP resulting in SIRS and early sepsis currently improving  Present on Admission:  . CAP (community acquired pneumonia) - finding on CT suggestive of atypical pneumonia will treat with Rocephin and azithromycin. Check HIV serology. Check influenza PCR  . Fever - current  result of obtain blood cultures. Lactate within normal limits  . Sepsis -no evidence of septic shock will treat with IV antibiotics and IV fluids currently patient is greatly improved does not appear to be toxic.     Prophylaxis:   Lovenox, Protonix  CODE STATUS: FULL CODE  Other plan as per orders.  I have spent a total of 55 min on this admission  Munirah Doerner 08/15/2013, 11:29 PM

## 2013-08-15 NOTE — ED Provider Notes (Signed)
CSN: [id number]     Arrival date & time 08/15/13  1645 History   First MD Initiated Contact with Patient 08/15/13 1645     Chief Complaint  Patient presents with  . Shortness of Breath   (Consider location/radiation/quality/duration/timing/severity/associated sxs/prior Treatment) HPI Comments: Pt is a 38 y/o male who has been ill with a cough / fever / cold sx for the last 5 days who presents with EMS after having a severe coughing fit at work while walking and becoming acutely worsened with his SOB>  Sx are severe, persistent and associated with pain when coughing.  He does smoke cigarettes for > 10 years, no other Drugs of Abuse.  He has been using large amounts of nsaids and night time cough medicines but is unsure which one.  He is coughing up phlegm.  EMS found the pt to be > 200 bpm on initial monitor - no rhythm strip - said that it decreased gradually to 140 when monitored and gave a neb which pt states helped a small amount.  He denies any RF for PE or ACS other than smoking.  Patient is a 37 y.o. male presenting with shortness of breath. The history is provided by the patient and the EMS personnel.  Shortness of Breath   Past Medical History  Diagnosis Date  . Seizures     10 years ago   Past Surgical History  Procedure Laterality Date  . Right knee      plate with screws  . Ankle surgery      left with screws   No family history on file. History  Substance Use Topics  . Smoking status: Current Every Day Smoker -- 1.00 packs/day  . Smokeless tobacco: Not on file  . Alcohol Use: Yes     Comment: ocassioinally    Review of Systems  Respiratory: Positive for shortness of breath.   All other systems reviewed and are negative.    Allergies  Review of patient's allergies indicates no known allergies.  Home Medications   Current Outpatient Rx  Name  Route  Sig  Dispense  Refill  . acetaminophen (TYLENOL) 500 MG tablet   Oral   Take 1,000 mg by mouth every 6 (six)  hours as needed for fever.         Marland Kitchen ibuprofen (ADVIL,MOTRIN) 200 MG tablet   Oral   Take 800 mg by mouth every 6 (six) hours as needed for fever.         Marland Kitchen OVER THE COUNTER MEDICATION   Oral   Take 20 mLs by mouth at bedtime as needed (cough, fever, congestion). CVS cough/cold medicine          BP 129/80  Pulse 120  Temp(Src) 100.9 F (38.3 C) (Oral)  Resp 19  Ht 5\' 10"  (1.778 m)  Wt 270 lb (122.471 kg)  BMI 38.74 kg/m2  SpO2 100% Physical Exam  Nursing note and vitals reviewed. Constitutional: He appears well-developed and well-nourished.  HENT:  Head: Normocephalic and atraumatic.  Mouth/Throat: Oropharynx is clear and moist. No oropharyngeal exudate.  Eyes: Conjunctivae and EOM are normal. Pupils are equal, round, and reactive to light. Right eye exhibits no discharge. Left eye exhibits no discharge. No scleral icterus.  Neck: Normal range of motion. Neck supple. No JVD present. No thyromegaly present.  Cardiovascular: Normal heart sounds and intact distal pulses.  Exam reveals no gallop and no friction rub.   No murmur heard. Tachy to 140, palpable pulses at radial  arteries bilaterally.  Pulmonary/Chest: Effort normal. He has wheezes. He has no rales.  Inc WOB, tachypneic but speaks in full sentences.  Diffuse mild bilateral wheezing, no retractions.  Abdominal: Soft. Bowel sounds are normal. He exhibits no distension and no mass. There is no tenderness.  Musculoskeletal: Normal range of motion. He exhibits no edema and no tenderness.  Lymphadenopathy:    He has no cervical adenopathy.  Neurological: He is alert. Coordination normal.  Skin: Skin is warm and dry. No rash noted. No erythema.  Psychiatric: He has a normal mood and affect. His behavior is normal.    ED Course  Procedures (including critical care time) Labs Review Labs Reviewed  CBC WITH DIFFERENTIAL - Abnormal; Notable for the following:    WBC 12.6 (*)    Neutro Abs 8.8 (*)    Monocytes  Absolute 1.3 (*)    All other components within normal limits  COMPREHENSIVE METABOLIC PANEL - Abnormal; Notable for the following:    Glucose, Bld 136 (*)    Total Bilirubin 0.2 (*)    GFR calc non Af Amer 76 (*)    GFR calc Af Amer 88 (*)    All other components within normal limits  SALICYLATE LEVEL - Abnormal; Notable for the following:    Salicylate Lvl 2.2 (*)    All other components within normal limits  ACETAMINOPHEN LEVEL  TSH  POCT I-STAT TROPONIN I   Imaging Review Dg Chest 2 View  08/15/2013   ADDENDUM REPORT: 08/15/2013 18:35  ADDENDUM: The CLINICAL DATA provided in the original report is incorrect. Please use the following:  CLINICAL DATA: CLINICAL DATA  Fever, cough and congestion with mid chest pain and shortness of breath.   Electronically Signed   By: Leanna Battles M.D.   On: 08/15/2013 18:35   08/15/2013   CLINICAL DATA:  Chest pain and back pain with shortness of breath. Elevated troponin and D-dimer levels.  EXAM: CHEST  2 VIEW  COMPARISON:  12/03/2011.  FINDINGS: Trachea is midline. Heart size normal. Possible added density in the right perihilar region, not visualized on the prior exam. Lungs are otherwise clear. No pleural fluid. Mild anterior wedging of mid and lower thoracic vertebral bodies appears unchanged.  IMPRESSION: Possible added density in the right perihilar region. CT chest without contrast could be performed in further evaluation. If there is concern for pulmonary embolus, CTA chest with contrast is the preferred examination.  Electronically Signed: By: Leanna Battles M.D. On: 08/15/2013 18:30   Ct Angio Chest Pe W/cm &/or Wo Cm  08/15/2013   *RADIOLOGY REPORT*  Clinical Data: Chest pain, palpitations evaluate for PE  CT ANGIOGRAPHY CHEST  Technique:  Multidetector CT imaging of the chest using the standard protocol during bolus administration of intravenous contrast. Multiplanar reconstructed images including MIPs were obtained and reviewed to evaluate  the vascular anatomy.  Contrast: OMNIPAQUE IOHEXOL 350 MG/ML SOLN  Comparison: Chest x-ray obtained earlier today at 1800 hours  Findings:  Mediastinum: Residual thymic tissue in the anterior mediastinum. Nonspecific, not enlarged by CT criteria mediastinal and hilar lymph nodes.  Index low right paratracheal node measures 8 mm in short axis.  Probable small hiatal hernia.  Heart/Vascular: Adequate opacification of the pulmonary arteries to the segmental level.  Evaluation the more distal arteries limited secondary to contrast bolus timing and respiratory motion.  No central filling defect to suggest acute pulmonary embolus. Conventional three-vessel arch anatomy.  Atherosclerotic vascular calcifications noted along the left anterior descending  coronary artery.  More detail region of coronary calcium is limited by non gated technique. Heart itself is within normal limits for size.  No pericardial effusion. The ascending thoracic aorta is ectatic at 4.1 cm.  Lungs/Pleura: Multi focal patchy areas of ground-glass attenuation airspace in a predominately bronchovascular distribution throughout the bilateral upper and to a lesser extent the lower lobes.  Trace dependent atelectasis in the lower lobes.  No pleural effusion.  Upper Abdomen: Diffuse low attenuation of the hepatic parenchyma suggests advanced hepatic steatosis.  Otherwise, limited imaging of the upper abdomen is unremarkable.  Bones: No acute fracture or aggressive appearing lytic or blastic osseous lesion.  IMPRESSION:  1.  Negative for acute pulmonary embolus to the segmental level. 2.  Multi-focal ground-glass attenuation opacities throughout both upper and, to a lesser extent, lower lobes consistent with an acute infectious or inflammatory process.  Atypical and viral infection should be considered given the distribution.  Alveolitis is another possibility.  3.  Early onset coronary artery disease as defined by the presence of calcified  atherosclerotic plaque along the course of the left anterior descending coronary artery.  Recommend further clinical evaluation and risk factor stratification with initiation of dietary, lifestyle and pharmacologic modifications if clinically warranted.  4.  Ectatic ascending thoracic aorta measures up to 4.1 cm.  Query clinical history of systemic hypertension, or bicuspid aortic valve.  5.  Mediastinal lymph nodes are likely reactive.  6.  Advanced hepatic steatosis.   Original Report Authenticated By: Malachy Moan, M.D.    MDM   1. Sepsis   2. Community acquired pneumonia    Pt is young, otherwise healthy and has no signs of arrhythmia.  He has cough, cold sx and has wheezing c/w resp d/o - r/u ptx, pna, not likely cardiac in origin.  ED ECG REPORT  I personally interpreted this EKG   Date: 08/15/2013   Rate: 134  Rhythm: normal sinus rhythm  QRS Axis: normal  Intervals: normal  ST/T Wave abnormalities: normal  Conduction Disutrbances:none  Narrative Interpretation:   Old EKG Reviewed: none available   The pt has persistent tachycardia at 120, fever to 100.9, slight hypoxia to 95% and WBC > 12,000, SIRS criteria met - abx for CAP initiated.  Pt still ill appearing but improved.    CT scan shows that he has a multifocal pneumonia with SIRS criteria - = sepsis.  No shock at this time.  Filed Vitals:   08/15/13 1730 08/15/13 1830 08/15/13 1845 08/15/13 1933  BP: 93/36 127/82 122/85 129/80  Pulse: 129 115 116 120  Temp:    100.9 F (38.3 C)  TempSrc:    Oral  Resp: 11   19  Height:      Weight:      SpO2: 93% 98% 98% 100%   CRITICAL CARE Performed by: Vida Roller Total critical care time: 30 Critical care time was exclusive of separately billable procedures and treating other patients. Critical care was necessary to treat or prevent imminent or life-threatening deterioration. Critical care was time spent personally by me on the following activities: development of  treatment plan with patient and/or surrogate as well as nursing, discussions with consultants, evaluation of patient's response to treatment, examination of patient, obtaining history from patient or surrogate, ordering and performing treatments and interventions, ordering and review of laboratory studies, ordering and review of radiographic studies, pulse oximetry and re-evaluation of patient's condition.   D/w Dr. Adela Glimpse who agrees with step down admission.  Arlys John  Alanda Amass, MD 08/15/13 2210

## 2013-08-15 NOTE — ED Notes (Signed)
Patient transported to CT 

## 2013-08-15 NOTE — ED Notes (Signed)
Admitting continued at bedside

## 2013-08-15 NOTE — ED Notes (Signed)
Admitting at bedside 

## 2013-08-15 NOTE — ED Notes (Signed)
Sob , cough and fever , nausea, chest pain, migraine headache for 4 days.   Tachycardia on the monitor

## 2013-08-15 NOTE — ED Notes (Signed)
MD at bedside. 

## 2013-08-16 ENCOUNTER — Encounter (HOSPITAL_COMMUNITY): Payer: Self-pay | Admitting: *Deleted

## 2013-08-16 LAB — COMPREHENSIVE METABOLIC PANEL
ALT: 15 U/L (ref 0–53)
BUN: 13 mg/dL (ref 6–23)
CO2: 23 mEq/L (ref 19–32)
Calcium: 8.6 mg/dL (ref 8.4–10.5)
Creatinine, Ser: 1.02 mg/dL (ref 0.50–1.35)
GFR calc Af Amer: >90 mL/min (ref >90)
GFR calc non Af Amer: >90 mL/min (ref >90)
Glucose, Bld: 141 mg/dL — ABNORMAL HIGH (ref 70–99)
Potassium: 3.7 mEq/L (ref 3.5–5.1)
Sodium: 135 mEq/L (ref 135–145)
Total Protein: 7 g/dL (ref 6.0–8.3)

## 2013-08-16 LAB — INFLUENZA PANEL BY PCR (TYPE A & B)
H1N1 flu by pcr: NOT DETECTED — AB
Influenza A By PCR: NEGATIVE

## 2013-08-16 LAB — MRSA PCR SCREENING: MRSA by PCR: NEGATIVE

## 2013-08-16 LAB — LEGIONELLA ANTIGEN, URINE: Legionella Antigen, Urine: NEGATIVE

## 2013-08-16 MED ORDER — ENOXAPARIN SODIUM 40 MG/0.4ML ~~LOC~~ SOLN
40.0000 mg | SUBCUTANEOUS | Status: DC
  Administered 2013-08-16 – 2013-08-18 (×3): 40 mg via SUBCUTANEOUS
  Filled 2013-08-16 (×4): qty 0.4

## 2013-08-16 MED ORDER — IPRATROPIUM BROMIDE 0.02 % IN SOLN
0.5000 mg | Freq: Four times a day (QID) | RESPIRATORY_TRACT | Status: DC
  Administered 2013-08-16 – 2013-08-18 (×8): 0.5 mg via RESPIRATORY_TRACT
  Filled 2013-08-16 (×9): qty 2.5

## 2013-08-16 MED ORDER — ONDANSETRON HCL 4 MG/2ML IJ SOLN: 4.0000 mg | Freq: Three times a day (TID) | INTRAMUSCULAR | Status: DC | PRN

## 2013-08-16 MED ORDER — SODIUM CHLORIDE 0.9 % IV SOLN
INTRAVENOUS | Status: DC
  Administered 2013-08-16 – 2013-08-18 (×5): via INTRAVENOUS

## 2013-08-16 MED ORDER — ACETAMINOPHEN 325 MG PO TABS: 650.0000 mg | ORAL_TABLET | Freq: Four times a day (QID) | ORAL | Status: DC | PRN

## 2013-08-16 MED ORDER — NICOTINE 21 MG/24HR TD PT24
21.0000 mg | MEDICATED_PATCH | Freq: Every day | TRANSDERMAL | Status: DC
  Administered 2013-08-16 – 2013-08-18 (×4): 21 mg via TRANSDERMAL
  Filled 2013-08-16 (×4): qty 1

## 2013-08-16 MED ORDER — DEXTROSE 5 % IV SOLN
500.0000 mg | INTRAVENOUS | Status: DC
  Filled 2013-08-16: qty 500

## 2013-08-16 MED ORDER — PANTOPRAZOLE SODIUM 40 MG PO TBEC: 40.0000 mg | DELAYED_RELEASE_TABLET | Freq: Every day | ORAL | Status: DC

## 2013-08-16 MED ORDER — HYDROCOD POLST-CHLORPHEN POLST 10-8 MG/5ML PO LQCR
5.0000 mL | Freq: Two times a day (BID) | ORAL | Status: DC | PRN
  Administered 2013-08-16 – 2013-08-17 (×3): 5 mL via ORAL
  Filled 2013-08-16 (×3): qty 5

## 2013-08-16 MED ORDER — LEVALBUTEROL HCL 0.63 MG/3ML IN NEBU
0.6300 mg | INHALATION_SOLUTION | RESPIRATORY_TRACT | Status: DC | PRN
  Administered 2013-08-17: 05:00:00 0.63 mg via RESPIRATORY_TRACT
  Filled 2013-08-16: qty 3

## 2013-08-16 MED ORDER — GUAIFENESIN ER 600 MG PO TB12
600.0000 mg | ORAL_TABLET | Freq: Two times a day (BID) | ORAL | Status: DC
  Administered 2013-08-16 – 2013-08-18 (×6): 600 mg via ORAL
  Filled 2013-08-16 (×9): qty 1

## 2013-08-16 MED ORDER — SODIUM CHLORIDE 0.9 % IV SOLN: INTRAVENOUS | Status: DC

## 2013-08-16 MED ORDER — DEXTROSE 5 % IV SOLN
1.0000 g | INTRAVENOUS | Status: DC
  Administered 2013-08-16: 1 g via INTRAVENOUS
  Filled 2013-08-16: qty 10

## 2013-08-16 NOTE — Progress Notes (Signed)
  Pt admitted to the unit. Pt is stable, alert and oriented per baseline. Oriented to room, staff, and call bell. Educated to call for any assistance. Bed in lowest position, call bell within reach- will continue to monitor. 

## 2013-08-16 NOTE — Progress Notes (Addendum)
[facility] PROGRESS NOTE  Dalton [name] JYN:[id number] DOB: June 09, 1976 DOA: 08/15/2013 PCP: No PCP Per Patient  HPI/Brief narrative This 37 year old male with self-reported history of chronic bronchitis, 16 pack year smoking history, admitted on 08/16/13 with 5-7 day history of high fevers, productive cough, several episodes of diarrhea and 1 day history of worsening dyspnea. Patient has been using OTC medications at home. Patient was exposed to a coworker with similar symptoms. Neither one of them has history of traveling out of state all internationally recently. No history of antibiotic use. EMS from the patient tachycardic >200 beats per minute (no rhythm strip), decreased to persistent tachycardia in 120'2, T 110.9, WBC > 12K, CTAchest: Multi-focal ground-glass attenuation opacities throughout both upper and, to a lesser extent, lower lobes consistent with an acute infectious or inflammatory process & no PE. Admitted for further Mx.   Assessment/Plan:  Sepsis/atypical pneumonia/diarrhea - Etiology: possible viral illness (GI & RT involvement)  - Lactate: 1.87. TSH 1.894 - Influenza panel: Negative - Urine pneumococcal antigen: Negative - Continue IV fluids - Discussed with ID: DC Antibiotics and monitor closely - Followup blood culture reports. - Send stool for culture and C. difficile PCR. - Will need followup imaging through in short resolution of CT findings. - Improving  Dehydration - Secondary to problem #1 and diarrhea - Continue IV fluids for additional 24 hours  Tobacco abuse - Cessation counselled - Nicotine patch    DVT prophylaxis:  Lovenox Lines/catheters: PIV Nutrition: Regular diet  Activity:  Up as tolerated Code Status: Full Family Communication: None Disposition Plan: Seen in SDU- OK to transfer to telemetry. DC home when medically stable.   Consultants:  None  Procedures:  None  Antibiotics:  IV Rocephin 10/7 >DCed  IV Azithromycin  10/7 > DCed  Subjective: Feels better. Denies dyspnea. Mild productive cough. No chest pain. Diarrhea decreasing.  Objective: Filed Vitals:   08/16/13 0314 08/16/13 0415 08/16/13 0738 08/16/13 0937  BP:  110/61 99/60   Pulse:  100 90   Temp:  99.4 F (37.4 C) 98 F (36.7 C)   TempSrc:  Oral Oral   Resp:  21 22   Height:      Weight:  125.2 kg (276 lb 0.3 oz)    SpO2: 95% 93% 94% 100%    Intake/Output Summary (Last 24 hours) at 08/16/13 1049 Last data filed at 08/16/13 0700  Gross per 24 hour  Intake   1930 ml  Output    250 ml  Net   1680 ml   Filed Weights   08/15/13 1657 08/16/13 0029 08/16/13 0415  Weight: 122.471 kg (270 lb) 125.2 kg (276 lb 0.3 oz) 125.2 kg (276 lb 0.3 oz)     Exam:  General exam:  yellow moderately built and nourished male patient sitting up comfortably in bed. Respiratory system:  few bibasilar crackles but rest of lung fields were clear to auscultation . No increased work of breathing. Cardiovascular system: S1 & S2 heard, RRR. No JVD, murmurs, gallops, clicks or pedal edema. Telemetry: Sinus rhythm  Gastrointestinal system: Abdomen is nondistended, soft and nontender. Normal bowel sounds heard. Central nervous system: Alert and oriented. No focal neurological deficits. Extremities: Symmetric 5 x 5 power.   Data Reviewed: Basic Metabolic Panel:  Recent Labs Lab 08/15/13 1700 08/16/13 0410  NA 135 135  K 4.3 3.7  CL 99 100  CO2 23 23  GLUCOSE 136* 141*  BUN 16 13  CREATININE 1.21 1.02  CALCIUM  9.0 8.6   Liver Function Tests:  Recent Labs Lab 08/15/13 1700 08/16/13 0410  AST 27 21  ALT 19 15  ALKPHOS 61 51  BILITOT 0.2* 0.5  PROT 7.8 7.0  ALBUMIN 3.7 3.3*   No results found for this basename: LIPASE, AMYLASE,  in the last 168 hours No results found for this basename: AMMONIA,  in the last 168 hours CBC:  Recent Labs Lab 08/15/13 1700  WBC 12.6*  NEUTROABS 8.8*  HGB 16.6  HCT 46.3  MCV 91.3  PLT 210   Cardiac  Enzymes: No results found for this basename: CKTOTAL, CKMB, CKMBINDEX, TROPONINI,  in the last 168 hours BNP (last 3 results) No results found for this basename: PROBNP,  in the last 8760 hours CBG: No results found for this basename: GLUCAP,  in the last 168 hours  Recent Results (from the past 240 hour(s))  MRSA PCR SCREENING     Status: None   Collection Time    08/16/13 12:31 AM      Result Value Range Status   MRSA by PCR NEGATIVE  NEGATIVE Final   Comment:            The GeneXpert MRSA Assay (FDA     approved for NASAL specimens     only), is one component of a     comprehensive MRSA colonization     surveillance program. It is not     intended to diagnose MRSA     infection nor to guide or     monitor treatment for     MRSA infections.      Additional labs: 1.  Salicylate level: 2.2, acetaminophen level < 15 2. TSH: 1.894 3. Influenza panel PCR; negative 4. Urine Pneumococcal Ag: Negative     Studies: Dg Chest 2 View  08/15/2013   ADDENDUM REPORT: 08/15/2013 18:35  ADDENDUM: The CLINICAL DATA provided in the original report is incorrect. Please use the following:  CLINICAL DATA: CLINICAL DATA  Fever, cough and congestion with mid chest pain and shortness of breath.   Electronically Signed   By: Leanna Battles M.D.   On: 08/15/2013 18:35   08/15/2013   CLINICAL DATA:  Chest pain and back pain with shortness of breath. Elevated troponin and D-dimer levels.  EXAM: CHEST  2 VIEW  COMPARISON:  12/03/2011.  FINDINGS: Trachea is midline. Heart size normal. Possible added density in the right perihilar region, not visualized on the prior exam. Lungs are otherwise clear. No pleural fluid. Mild anterior wedging of mid and lower thoracic vertebral bodies appears unchanged.  IMPRESSION: Possible added density in the right perihilar region. CT chest without contrast could be performed in further evaluation. If there is concern for pulmonary embolus, CTA chest with contrast is the  preferred examination.  Electronically Signed: By: Leanna Battles M.D. On: 08/15/2013 18:30   Ct Angio Chest Pe W/cm &/or Wo Cm  08/15/2013   *RADIOLOGY REPORT*  Clinical Data: Chest pain, palpitations evaluate for PE  CT ANGIOGRAPHY CHEST  Technique:  Multidetector CT imaging of the chest using the standard protocol during bolus administration of intravenous contrast. Multiplanar reconstructed images including MIPs were obtained and reviewed to evaluate the vascular anatomy.  Contrast: OMNIPAQUE IOHEXOL 350 MG/ML SOLN  Comparison: Chest x-ray obtained earlier today at 1800 hours  Findings:  Mediastinum: Residual thymic tissue in the anterior mediastinum. Nonspecific, not enlarged by CT criteria mediastinal and hilar lymph nodes.  Index low right paratracheal node measures 8  mm in short axis.  Probable small hiatal hernia.  Heart/Vascular: Adequate opacification of the pulmonary arteries to the segmental level.  Evaluation the more distal arteries limited secondary to contrast bolus timing and respiratory motion.  No central filling defect to suggest acute pulmonary embolus. Conventional three-vessel arch anatomy.  Atherosclerotic vascular calcifications noted along the left anterior descending coronary artery.  More detail region of coronary calcium is limited by non gated technique. Heart itself is within normal limits for size.  No pericardial effusion. The ascending thoracic aorta is ectatic at 4.1 cm.  Lungs/Pleura: Multi focal patchy areas of ground-glass attenuation airspace in a predominately bronchovascular distribution throughout the bilateral upper and to a lesser extent the lower lobes.  Trace dependent atelectasis in the lower lobes.  No pleural effusion.  Upper Abdomen: Diffuse low attenuation of the hepatic parenchyma suggests advanced hepatic steatosis.  Otherwise, limited imaging of the upper abdomen is unremarkable.  Bones: No acute fracture or aggressive appearing lytic or blastic  osseous lesion.  IMPRESSION:  1.  Negative for acute pulmonary embolus to the segmental level. 2.  Multi-focal ground-glass attenuation opacities throughout both upper and, to a lesser extent, lower lobes consistent with an acute infectious or inflammatory process.  Atypical and viral infection should be considered given the distribution.  Alveolitis is another possibility.  3.  Early onset coronary artery disease as defined by the presence of calcified atherosclerotic plaque along the course of the left anterior descending coronary artery.  Recommend further clinical evaluation and risk factor stratification with initiation of dietary, lifestyle and pharmacologic modifications if clinically warranted.  4.  Ectatic ascending thoracic aorta measures up to 4.1 cm.  Query clinical history of systemic hypertension, or bicuspid aortic valve.  5.  Mediastinal lymph nodes are likely reactive.  6.  Advanced hepatic steatosis.   Original Report Authenticated By: Malachy Moan, M.D.        Scheduled Meds: . sodium chloride   Intravenous STAT  . azithromycin  500 mg Intravenous Q24H  . cefTRIAXone (ROCEPHIN)  IV  1 g Intravenous Q24H  . enoxaparin (LOVENOX) injection  40 mg Subcutaneous Q24H  . guaiFENesin  600 mg Oral BID  . ipratropium  0.5 mg Nebulization Q6H  . nicotine  21 mg Transdermal Daily  . pantoprazole  40 mg Oral Q1200   Continuous Infusions: . sodium chloride 150 mL/hr at 08/16/13 0108    Active Problems:   Fever   Sepsis   CAP (community acquired pneumonia)    Time spent: 60 minutes.    [facility]  [facility] Pager [phone].   If 8PM-8AM, please contact night-coverage at [email or web address], password [facility] 08/16/2013, 10:49 AM  LOS: 1 day

## 2013-08-17 DIAGNOSIS — F17200 Nicotine dependence, unspecified, uncomplicated: Secondary | ICD-10-CM

## 2013-08-17 DIAGNOSIS — R197 Diarrhea, unspecified: Secondary | ICD-10-CM

## 2013-08-17 LAB — BASIC METABOLIC PANEL
BUN: 10 mg/dL (ref 6–23)
CO2: 23 mEq/L (ref 19–32)
Calcium: 8.9 mg/dL (ref 8.4–10.5)
Chloride: 101 mEq/L (ref 96–112)
Creatinine, Ser: 0.94 mg/dL (ref 0.50–1.35)
GFR calc Af Amer: >90 mL/min (ref >90)
GFR calc non Af Amer: >90 mL/min (ref >90)
Glucose, Bld: 110 mg/dL — ABNORMAL HIGH (ref 70–99)
Potassium: 3.9 mEq/L (ref 3.5–5.1)
Sodium: 136 mEq/L (ref 135–145)

## 2013-08-17 LAB — CBC
HCT: 40.2 % (ref 39.0–52.0)
Hemoglobin: 13.9 g/dL (ref 13.0–17.0)
MCH: 31.6 pg (ref 26.0–34.0)
MCHC: 34.6 g/dL (ref 30.0–36.0)
MCV: 91.4 fL (ref 78.0–100.0)
Platelets: 185 10*3/uL (ref 150–400)
RBC: 4.4 MIL/uL (ref 4.22–5.81)
RDW: 12.9 % (ref 11.5–15.5)
WBC: 8.6 10*3/uL (ref 4.0–10.5)

## 2013-08-17 NOTE — Progress Notes (Signed)
[facility]                                                                                Patient Demographics  Dalton [name], is a 37 y.o. male, DOB - Jul 21, 1976, OZH:[id number]  Admit date - 08/15/2013   Admitting Physician Therisa Doyne, MD  Outpatient Primary MD for the patient is No PCP Per Patient  LOS - 2   Chief Complaint  Patient presents with  . Shortness of Breath        Assessment & Plan  Sepsis secondary to multifactorial causes, including atypical pneumonia/diarrhea  - Etiology: possible viral illness (gastro/respiratory involvement) - Leukocytosis improving, WBC 8.6 - Influenza panel: Negative  - Urine pneumococcal antigen: Negative  - CDiff PCR negative - Continue IV fluids  - Antibiotics were discontinued, will continue communication with ID - Blood cultures negative thus far. - Will need followup imaging through in short resolution of CT findings.   Dehydration  - Secondary to problem #1 and diarrhea  - Continue IV fluids for additional 24 hours   Tobacco abuse  - Cessation counselled  - Nicotine patch   Active Problems:   Fever   Sepsis   CAP (community acquired pneumonia)   Code Status: Full  Family Communication: None  Disposition Plan: Will plan on discharge tomorrow.  Trending labs, although likely viral in etiology.   Procedures None  Consults  None  DVT Prophylaxis  Lovenox  Lab Results  Component Value Date   PLT 185 08/17/2013    Medications  Scheduled Meds: . enoxaparin (LOVENOX) injection  40 mg Subcutaneous Q24H  . guaiFENesin  600 mg Oral BID  . ipratropium  0.5 mg Nebulization Q6H  . nicotine  21 mg Transdermal Daily   Continuous Infusions: . sodium chloride 125 mL/hr at 08/17/13 0233   PRN Meds:.acetaminophen, chlorpheniramine-HYDROcodone, levalbuterol, ondansetron (ZOFRAN) IV  Antibiotics    Anti-infectives   Start     Dose/Rate Route Frequency Ordered Stop   08/16/13 1200  azithromycin  (ZITHROMAX) 500 mg in dextrose 5 % 250 mL IVPB  Status:  Discontinued     500 mg 250 mL/hr over 60 Minutes Intravenous Every 24 hours 08/16/13 0026 08/16/13 1125   08/16/13 1000  cefTRIAXone (ROCEPHIN) 1 g in dextrose 5 % 50 mL IVPB  Status:  Discontinued     1 g 100 mL/hr over 30 Minutes Intravenous Every 24 hours 08/16/13 0026 08/16/13 1125   08/15/13 2100  cefTRIAXone (ROCEPHIN) 1 g in dextrose 5 % 50 mL IVPB     1 g 100 mL/hr over 30 Minutes Intravenous  Once 08/15/13 2047 08/15/13 2223   08/15/13 2100  azithromycin (ZITHROMAX) tablet 500 mg     500 mg Oral  Once 08/15/13 2047 08/15/13 2115       Time Spent in minutes   30 minutes   Jerrion Tabbert D.O. on 08/17/2013 at 9:10 AM  Between 7am to 7pm - Pager - [phone]  After 7pm go to [email or web address] - password [email or web address]  And look for the night coverage person covering for me after hours  [facility] Office  [phone]    Subjective:   Dalton [name] seen and  examined today.  Currently complains of cough, however, improving some, dry cough.  Still feels weak and achy.  Complains of not sleeping well due to cough.   Still complains of diarrhea, however, improved from previous days.  Patient denies dizziness, chest pain, shortness of breath, abdominal pain, N/V/C, new weakness, numbess, tingling.    Objective:   Filed Vitals:   08/16/13 2108 08/16/13 2244 08/17/13 0006 08/17/13 0416  BP: 135/91 155/93 154/89 127/79  Pulse: 89 96 97 95  Temp: 98.8 F (37.1 C) 99.7 F (37.6 C) 99.1 F (37.3 C) 98.9 F (37.2 C)  TempSrc: Oral Oral Oral Oral  Resp: 15 20 20 18   Height:      Weight:      SpO2: 97% 98% 97% 98%    Wt Readings from Last 3 Encounters:  08/16/13 125.2 kg (276 lb 0.3 oz)  04/11/13 117.935 kg (260 lb)  05/17/12 117.935 kg (260 lb)     Intake/Output Summary (Last 24 hours) at 08/17/13 0910 Last data filed at 08/16/13 2000  Gross per 24 hour  Intake    250 ml  Output      0 ml  Net    250 ml     Exam  General: Well developed, well nourished, NAD, appears stated age  HEENT: NCAT, PERRLA, EOMI, Anicteic Sclera, mucous membranes moist.   Neck: Supple, no JVD, no masses,  Cardiovascular: S1 S2 auscultated, no rubs, murmurs or gallops.  No ventricular heave. Regular rate and rhythm.  Respiratory: Bibasilar crackles.  Abdomen: Soft, obese, non-tender to palpation, + bowel sounds  Extremities: warm dry without cyanosis clubbing or edema  Neuro: AAOx3, cranial nerves grossly intact. Strength 5/5 in patient's upper and lower extremities bilaterally  Skin: Without rashes exudates or nodules  Psych: Normal affect and demeanor with intact judgement and insight    Data Review   Micro Results Recent Results (from the past 240 hour(s))  MRSA PCR SCREENING     Status: None   Collection Time    08/16/13 12:31 AM      Result Value Range Status   MRSA by PCR NEGATIVE  NEGATIVE Final   Comment:            The GeneXpert MRSA Assay (FDA     approved for NASAL specimens     only), is one component of a     comprehensive MRSA colonization     surveillance program. It is not     intended to diagnose MRSA     infection nor to guide or     monitor treatment for     MRSA infections.  CULTURE, BLOOD (ROUTINE X 2)     Status: None   Collection Time    08/16/13  1:15 AM      Result Value Range Status   Specimen Description BLOOD RIGHT ARM   Final   Special Requests BOTTLES DRAWN AEROBIC ONLY 10CC   Final   Culture  Setup Time     Final   Value: 08/16/2013 07:32     Performed at [facility]   Culture     Final   Value:        BLOOD CULTURE RECEIVED NO GROWTH TO DATE CULTURE WILL BE HELD FOR 5 DAYS BEFORE ISSUING A FINAL NEGATIVE REPORT     Performed at [facility]   Report Status PENDING   Incomplete  CULTURE, BLOOD (ROUTINE X 2)     Status: None  Collection Time    08/16/13  1:30 AM      Result Value Range Status   Specimen Description BLOOD LEFT ARM    Final   Special Requests BOTTLES DRAWN AEROBIC ONLY 10CC   Final   Culture  Setup Time     Final   Value: 08/16/2013 07:32     Performed at [facility]   Culture     Final   Value:        BLOOD CULTURE RECEIVED NO GROWTH TO DATE CULTURE WILL BE HELD FOR 5 DAYS BEFORE ISSUING A FINAL NEGATIVE REPORT     Performed at [facility]   Report Status PENDING   Incomplete  CLOSTRIDIUM DIFFICILE BY PCR     Status: None   Collection Time    08/16/13 11:13 AM      Result Value Range Status   C difficile by pcr NEGATIVE  NEGATIVE Final    Radiology Reports Dg Chest 2 View  08/15/2013   ADDENDUM REPORT: 08/15/2013 18:35  ADDENDUM: The CLINICAL DATA provided in the original report is incorrect. Please use the following:  CLINICAL DATA: CLINICAL DATA  Fever, cough and congestion with mid chest pain and shortness of breath.   Electronically Signed   By: Leanna Battles M.D.   On: 08/15/2013 18:35   08/15/2013   CLINICAL DATA:  Chest pain and back pain with shortness of breath. Elevated troponin and D-dimer levels.  EXAM: CHEST  2 VIEW  COMPARISON:  12/03/2011.  FINDINGS: Trachea is midline. Heart size normal. Possible added density in the right perihilar region, not visualized on the prior exam. Lungs are otherwise clear. No pleural fluid. Mild anterior wedging of mid and lower thoracic vertebral bodies appears unchanged.  IMPRESSION: Possible added density in the right perihilar region. CT chest without contrast could be performed in further evaluation. If there is concern for pulmonary embolus, CTA chest with contrast is the preferred examination.  Electronically Signed: By: Leanna Battles M.D. On: 08/15/2013 18:30   Ct Angio Chest Pe W/cm &/or Wo Cm  08/15/2013   *RADIOLOGY REPORT*  Clinical Data: Chest pain, palpitations evaluate for PE  CT ANGIOGRAPHY CHEST  Technique:  Multidetector CT imaging of the chest using the standard protocol during bolus administration of intravenous contrast.  Multiplanar reconstructed images including MIPs were obtained and reviewed to evaluate the vascular anatomy.  Contrast: OMNIPAQUE IOHEXOL 350 MG/ML SOLN  Comparison: Chest x-ray obtained earlier today at 1800 hours  Findings:  Mediastinum: Residual thymic tissue in the anterior mediastinum. Nonspecific, not enlarged by CT criteria mediastinal and hilar lymph nodes.  Index low right paratracheal node measures 8 mm in short axis.  Probable small hiatal hernia.  Heart/Vascular: Adequate opacification of the pulmonary arteries to the segmental level.  Evaluation the more distal arteries limited secondary to contrast bolus timing and respiratory motion.  No central filling defect to suggest acute pulmonary embolus. Conventional three-vessel arch anatomy.  Atherosclerotic vascular calcifications noted along the left anterior descending coronary artery.  More detail region of coronary calcium is limited by non gated technique. Heart itself is within normal limits for size.  No pericardial effusion. The ascending thoracic aorta is ectatic at 4.1 cm.  Lungs/Pleura: Multi focal patchy areas of ground-glass attenuation airspace in a predominately bronchovascular distribution throughout the bilateral upper and to a lesser extent the lower lobes.  Trace dependent atelectasis in the lower lobes.  No pleural effusion.  Upper Abdomen: Diffuse low attenuation of  the hepatic parenchyma suggests advanced hepatic steatosis.  Otherwise, limited imaging of the upper abdomen is unremarkable.  Bones: No acute fracture or aggressive appearing lytic or blastic osseous lesion.  IMPRESSION:  1.  Negative for acute pulmonary embolus to the segmental level. 2.  Multi-focal ground-glass attenuation opacities throughout both upper and, to a lesser extent, lower lobes consistent with an acute infectious or inflammatory process.  Atypical and viral infection should be considered given the distribution.  Alveolitis is another possibility.  3.   Early onset coronary artery disease as defined by the presence of calcified atherosclerotic plaque along the course of the left anterior descending coronary artery.  Recommend further clinical evaluation and risk factor stratification with initiation of dietary, lifestyle and pharmacologic modifications if clinically warranted.  4.  Ectatic ascending thoracic aorta measures up to 4.1 cm.  Query clinical history of systemic hypertension, or bicuspid aortic valve.  5.  Mediastinal lymph nodes are likely reactive.  6.  Advanced hepatic steatosis.   Original Report Authenticated By: Malachy Moan, M.D.    CBC  Recent Labs Lab 08/15/13 1700 08/17/13 0550  WBC 12.6* 8.6  HGB 16.6 13.9  HCT 46.3 40.2  PLT 210 185  MCV 91.3 91.4  MCH 32.7 31.6  MCHC 35.9 34.6  RDW 13.2 12.9  LYMPHSABS 2.4  --   MONOABS 1.3*  --   EOSABS 0.1  --   BASOSABS 0.0  --     Chemistries   Recent Labs Lab 08/15/13 1700 08/16/13 0410 08/17/13 0550  NA 135 135 136  K 4.3 3.7 3.9  CL 99 100 101  CO2 23 23 23   GLUCOSE 136* 141* 110*  BUN 16 13 10   CREATININE 1.21 1.02 0.94  CALCIUM 9.0 8.6 8.9  AST 27 21  --   ALT 19 15  --   ALKPHOS 61 51  --   BILITOT 0.2* 0.5  --    ------------------------------------------------------------------------------------------------------------------ estimated creatinine clearance is 144.3 ml/min (by C-G formula based on Cr of 0.94). ------------------------------------------------------------------------------------------------------------------ No results found for this basename: HGBA1C,  in the last 72 hours ------------------------------------------------------------------------------------------------------------------ No results found for this basename: CHOL, HDL, LDLCALC, TRIG, CHOLHDL, LDLDIRECT,  in the last 72 hours ------------------------------------------------------------------------------------------------------------------  Recent Labs  08/15/13 1700   TSH 1.894   ------------------------------------------------------------------------------------------------------------------ No results found for this basename: VITAMINB12, FOLATE, FERRITIN, TIBC, IRON, RETICCTPCT,  in the last 72 hours  Coagulation profile No results found for this basename: INR, PROTIME,  in the last 168 hours  No results found for this basename: DDIMER,  in the last 72 hours  Cardiac Enzymes No results found for this basename: CK, CKMB, TROPONINI, MYOGLOBIN,  in the last 168 hours ------------------------------------------------------------------------------------------------------------------ No components found with this basename: POCBNP,

## 2013-08-18 LAB — CBC
HCT: 41.7 % (ref 39.0–52.0)
Hemoglobin: 14.6 g/dL (ref 13.0–17.0)
MCH: 31.7 pg (ref 26.0–34.0)
MCHC: 35 g/dL (ref 30.0–36.0)

## 2013-08-18 LAB — GI PATHOGEN PANEL BY PCR, STOOL
Cryptosporidium by PCR: NEGATIVE
E coli (STEC): NEGATIVE
G lamblia by PCR: NEGATIVE
Norovirus GI/GII: NEGATIVE
Rotavirus A by PCR: NEGATIVE
Shigella by PCR: NEGATIVE

## 2013-08-18 MED ORDER — NICOTINE 21 MG/24HR TD PT24: 1.0000 | MEDICATED_PATCH | Freq: Every day | TRANSDERMAL | Status: DC

## 2013-08-18 MED ORDER — GUAIFENESIN ER 600 MG PO TB12: 600.0000 mg | ORAL_TABLET | Freq: Two times a day (BID) | ORAL | Status: DC

## 2013-08-18 MED ORDER — HYDROCOD POLST-CHLORPHEN POLST 10-8 MG/5ML PO LQCR: 5.0000 mL | Freq: Two times a day (BID) | ORAL | Status: DC | PRN

## 2013-08-18 NOTE — Progress Notes (Signed)
Patient discharge teaching given, including activity, diet, follow-up appoints, and medications. Patient verbalized understanding of all discharge instructions. IV access was d/c'd. Vitals are stable. Skin is intact except as charted in most recent assessments. Pt to be escorted out by NT, to be driven home by family. 

## 2013-08-18 NOTE — Discharge Summary (Signed)
Physician Discharge Summary  Dalton [name]:[id number] DOB: 13-Apr-1976 DOA: 08/15/2013  PCP: No PCP Per Patient  Admit date: 08/15/2013 Discharge date: 08/18/2013  Time spent: 45 minutes  Recommendations for Outpatient Follow-up:  Patient is to set up an appointment with the [facility] within one to two weeks of discharge. Continue taking cough syrup. Patient instructed and counseled on smoking cessation.  Discharge Diagnoses:  Active Problems: Sepsis secondary to multifactorial causes including atypical pneumonia and diarrhea Fever Tobacco use disorder  Discharge Condition: Stable  Diet recommendation: Heart healthy  Filed Weights   08/15/13 1657 08/16/13 0029 08/16/13 0415  Weight: 122.471 kg (270 lb) 125.2 kg (276 lb 0.3 oz) 125.2 kg (276 lb 0.3 oz)    History of present illness:  Dalton [name] is a 37 y.o. male  has a past medical history of Seizures.  Presented with 4 day history of cough, fever up to 103 and fatigue. He was exposed to coworker with similar illness. He became severely short of breath and called EMS. He was found to be severely tachycardic and was brought in to ER. After getting IVF he started to do better. He is feeling much better now. He has been up and ambulating without much trouble not on oxygen at this time. Denies HIV risk factors.   Hospital Course:  This is a 37 year old male with a history of seizures that presented to the emergency department for complaints of cough and fever. Patient was experiencing fevers of up to 103F at home. Stated that he had coworker or similar illnesses however the coworker is at home now. Patient was admitted for possible community-acquired pneumonia and diarrhea.  While in the hospital on patient did have leukocytosis however with no antibiotic therapy had improved. Patient did have a negative influenza panel as well as a urine pneumococcal antigen. He was also tested for C. difficile PCR should also found to be negative. ID  was consulted by the previous hospitalist who recommended to her antibiotics and monitor patient. This was thought to be of viral etiology cause.  Patient also did have some CT findings it will need followup with in 3 months.  Patient was also found to have some dehydration he was given IV fluids. This was thought to be secondary to his possible pneumonia and diarrhea. Patient was also counseled on tobacco cessation and was given nicotine patch.   Procedures:  None  Consultations:  None  Discharge Exam: Filed Vitals:   08/18/13 0508  BP: 135/89  Pulse: 92  Temp: 98.6 F (37 C)  Resp: 18    General: Well developed, well nourished, NAD, appears stated age  HEENT: NCAT, PERRLA, EOMI, Anicteic Sclera, mucous membranes moist.  Neck: Supple, no JVD, no masses,  Cardiovascular: S1 S2 auscultated, no rubs, murmurs or gallops. No ventricular heave. Regular rate and rhythm.  Respiratory: Clear to auscultation Abdomen: Soft, obese, non-tender to palpation, + bowel sounds  Extremities: warm dry without cyanosis clubbing or edema  Neuro: AAOx3, cranial nerves grossly intact. Strength 5/5 in patient's upper and lower extremities bilaterally  Skin: Without rashes exudates or nodules  Psych: Normal affect and demeanor with intact judgement and insight   Discharge Instructions  Discharge Orders   Future Orders Complete By Expires   Diet - low sodium heart healthy  As directed    Discharge instructions  As directed    Comments:     [facility]  two weeks of discharge.  Continue taking cough syrup.  Patient instructed and counseled on smoking cessation.   Increase activity slowly  As directed        Medication List         acetaminophen 500 MG tablet  Commonly known as:  TYLENOL  Take 1,000 mg by mouth every 6 (six) hours as needed for fever.     chlorpheniramine-HYDROcodone 10-8 MG/5ML Lqcr  Commonly known as:  TUSSIONEX  Take  5 mLs by mouth every 12 (twelve) hours as needed.     guaiFENesin 600 MG 12 hr tablet  Commonly known as:  MUCINEX  Take 1 tablet (600 mg total) by mouth 2 (two) times daily.     ibuprofen 200 MG tablet  Commonly known as:  ADVIL,MOTRIN  Take 800 mg by mouth every 6 (six) hours as needed for fever.     nicotine 21 mg/24hr patch  Commonly known as:  NICODERM CQ - dosed in mg/24 hours  Place 1 patch onto the skin daily.     OVER THE COUNTER MEDICATION  Take 20 mLs by mouth at bedtime as needed (cough, fever, congestion). CVS cough/cold medicine       No Known Allergies     Follow-up Information   Follow up with No PCP Per Patient. Schedule an appointment as soon as possible for a visit in 2 weeks. (Patient is to set up an appointment with the [facility].)    Specialty:  General Practice   Contact information:   [address] [place] [address] [phone]        The results of significant diagnostics from this hospitalization (including imaging, microbiology, ancillary and laboratory) are listed below for reference.    Significant Diagnostic Studies: Dg Chest 2 View  08/15/2013   ADDENDUM REPORT: 08/15/2013 18:35  ADDENDUM: The CLINICAL DATA provided in the original report is incorrect. Please use the following:  CLINICAL DATA: CLINICAL DATA  Fever, cough and congestion with mid chest pain and shortness of breath.   Electronically Signed   By: Leanna Battles M.D.   On: 08/15/2013 18:35   08/15/2013   CLINICAL DATA:  Chest pain and back pain with shortness of breath. Elevated troponin and D-dimer levels.  EXAM: CHEST  2 VIEW  COMPARISON:  12/03/2011.  FINDINGS: Trachea is midline. Heart size normal. Possible added density in the right perihilar region, not visualized on the prior exam. Lungs are otherwise clear. No pleural fluid. Mild anterior wedging of mid and lower thoracic vertebral bodies appears unchanged.  IMPRESSION: Possible added density in the right perihilar  region. CT chest without contrast could be performed in further evaluation. If there is concern for pulmonary embolus, CTA chest with contrast is the preferred examination.  Electronically Signed: By: Leanna Battles M.D. On: 08/15/2013 18:30   Ct Angio Chest Pe W/cm &/or Wo Cm  08/15/2013   *RADIOLOGY REPORT*  Clinical Data: Chest pain, palpitations evaluate for PE  CT ANGIOGRAPHY CHEST  Technique:  Multidetector CT imaging of the chest using the standard protocol during bolus administration of intravenous contrast. Multiplanar reconstructed images including MIPs were obtained and reviewed to evaluate the vascular anatomy.  Contrast: OMNIPAQUE IOHEXOL 350 MG/ML SOLN  Comparison: Chest x-ray obtained earlier today at 1800 hours  Findings:  Mediastinum: Residual thymic tissue in the anterior mediastinum. Nonspecific, not enlarged by CT criteria mediastinal and hilar lymph nodes.  Index low right paratracheal node measures 8 mm in short axis.  Probable small hiatal hernia.  Heart/Vascular: Adequate opacification of the pulmonary arteries to the segmental level.  Evaluation the more distal arteries limited secondary to contrast bolus timing and respiratory motion.  No central filling defect to suggest acute pulmonary embolus. Conventional three-vessel arch anatomy.  Atherosclerotic vascular calcifications noted along the left anterior descending coronary artery.  More detail region of coronary calcium is limited by non gated technique. Heart itself is within normal limits for size.  No pericardial effusion. The ascending thoracic aorta is ectatic at 4.1 cm.  Lungs/Pleura: Multi focal patchy areas of ground-glass attenuation airspace in a predominately bronchovascular distribution throughout the bilateral upper and to a lesser extent the lower lobes.  Trace dependent atelectasis in the lower lobes.  No pleural effusion.  Upper Abdomen: Diffuse low attenuation of the hepatic parenchyma suggests advanced hepatic  steatosis.  Otherwise, limited imaging of the upper abdomen is unremarkable.  Bones: No acute fracture or aggressive appearing lytic or blastic osseous lesion.  IMPRESSION:  1.  Negative for acute pulmonary embolus to the segmental level. 2.  Multi-focal ground-glass attenuation opacities throughout both upper and, to a lesser extent, lower lobes consistent with an acute infectious or inflammatory process.  Atypical and viral infection should be considered given the distribution.  Alveolitis is another possibility.  3.  Early onset coronary artery disease as defined by the presence of calcified atherosclerotic plaque along the course of the left anterior descending coronary artery.  Recommend further clinical evaluation and risk factor stratification with initiation of dietary, lifestyle and pharmacologic modifications if clinically warranted.  4.  Ectatic ascending thoracic aorta measures up to 4.1 cm.  Query clinical history of systemic hypertension, or bicuspid aortic valve.  5.  Mediastinal lymph nodes are likely reactive.  6.  Advanced hepatic steatosis.   Original Report Authenticated By: Malachy Moan, M.D.    Microbiology: Recent Results (from the past 240 hour(s))  MRSA PCR SCREENING     Status: None   Collection Time    08/16/13 12:31 AM      Result Value Range Status   MRSA by PCR NEGATIVE  NEGATIVE Final   Comment:            The GeneXpert MRSA Assay (FDA     approved for NASAL specimens     only), is one component of a     comprehensive MRSA colonization     surveillance program. It is not     intended to diagnose MRSA     infection nor to guide or     monitor treatment for     MRSA infections.  CULTURE, BLOOD (ROUTINE X 2)     Status: None   Collection Time    08/16/13  1:15 AM      Result Value Range Status   Specimen Description BLOOD RIGHT ARM   Final   Special Requests BOTTLES DRAWN AEROBIC ONLY 10CC   Final   Culture  Setup Time     Final   Value: 08/16/2013 07:32      Performed at [facility]   Culture     Final   Value:        BLOOD CULTURE RECEIVED NO GROWTH TO DATE CULTURE WILL BE HELD FOR 5 DAYS BEFORE ISSUING A FINAL NEGATIVE REPORT     Performed at [facility]   Report Status PENDING   Incomplete  CULTURE, BLOOD (ROUTINE X 2)     Status: None   Collection Time  08/16/13  1:30 AM      Result Value Range Status   Specimen Description BLOOD LEFT ARM   Final   Special Requests BOTTLES DRAWN AEROBIC ONLY 10CC   Final   Culture  Setup Time     Final   Value: 08/16/2013 07:32     Performed at [facility]   Culture     Final   Value:        BLOOD CULTURE RECEIVED NO GROWTH TO DATE CULTURE WILL BE HELD FOR 5 DAYS BEFORE ISSUING A FINAL NEGATIVE REPORT     Performed at [facility]   Report Status PENDING   Incomplete  CLOSTRIDIUM DIFFICILE BY PCR     Status: None   Collection Time    08/16/13 11:13 AM      Result Value Range Status   C difficile by pcr NEGATIVE  NEGATIVE Final     Labs: Basic Metabolic Panel:  Recent Labs Lab 08/15/13 1700 08/16/13 0410 08/17/13 0550  NA 135 135 136  K 4.3 3.7 3.9  CL 99 100 101  CO2 23 23 23   GLUCOSE 136* 141* 110*  BUN 16 13 10   CREATININE 1.21 1.02 0.94  CALCIUM 9.0 8.6 8.9   Liver Function Tests:  Recent Labs Lab 08/15/13 1700 08/16/13 0410  AST 27 21  ALT 19 15  ALKPHOS 61 51  BILITOT 0.2* 0.5  PROT 7.8 7.0  ALBUMIN 3.7 3.3*   No results found for this basename: LIPASE, AMYLASE,  in the last 168 hours No results found for this basename: AMMONIA,  in the last 168 hours CBC:  Recent Labs Lab 08/15/13 1700 08/17/13 0550  WBC 12.6* 8.6  NEUTROABS 8.8*  --   HGB 16.6 13.9  HCT 46.3 40.2  MCV 91.3 91.4  PLT 210 185   Cardiac Enzymes: No results found for this basename: CKTOTAL, CKMB, CKMBINDEX, TROPONINI,  in the last 168 hours BNP: BNP (last 3 results) No results found for this basename: PROBNP,  in the last 8760 hours CBG:  Recent  Labs Lab 08/18/13 0758  GLUCAP 115*      Signed:  Leor Whyte  [facility] 08/18/2013, 9:05 AM

## 2013-08-18 NOTE — Care Management Note (Signed)
    Page 1 of 1   08/18/2013     11:32:18 AM   CARE MANAGEMENT NOTE 08/18/2013  Patient:  Dalton [name], Dalton [name]   Account Number:  [id number]  Date Initiated:  08/18/2013  Documentation initiated by:  Letha Cape  Subjective/Objective Assessment:   dx fever  admit- lives alone, pta indep.     Action/Plan:   Anticipated DC Date:  08/18/2013   Anticipated DC Plan:  HOME/SELF CARE      DC Planning Services  CM consult  [facility]  [facility]      Choice offered to / List presented to:             Status of service:  Completed, signed off Medicare Important Message given?   (If response is "NO", the following Medicare IM given date fields will be blank) Date Medicare IM given:   Date Additional Medicare IM given:    Discharge Disposition:  HOME/SELF CARE  Per UR Regulation:  Reviewed for med. necessity/level of care/duration of stay  If discussed at Long Length of Stay Meetings, dates discussed:    Comments:  08/18/13 11:20 Letha Cape RN, BSN [phone] patient lives alone , pta indep.  Patient states he has no insurance and no pcp, I scheduled patient appt with [facility] for hospital f/u and orange card apt.  Patient also stated he could not afford $70 for tussinex cough medicine, assisted patient with Match letter which was given to the patient.  Explained to patient if he uses the Match program on this admit he could not use it again this year.  He states he understands. Patient is for dc today, he has transportation.

## 2013-08-22 LAB — CULTURE, BLOOD (ROUTINE X 2): Culture: NO GROWTH

## 2013-08-22 MED ORDER — IOHEXOL 350 MG/ML SOLN
100.0000 mL | Freq: Once | INTRAVENOUS | Status: AC | PRN
  Administered 2013-08-22: 07:00:00 100 mL via INTRAVENOUS

## 2013-08-23 ENCOUNTER — Emergency Department (HOSPITAL_COMMUNITY)
Admission: EM | Admit: 2013-08-23 | Discharge: 2013-08-24 | Disposition: A | Discharge status: Home / Self Care | Payer: Self-pay | Attending: Emergency Medicine | Admitting: Emergency Medicine

## 2013-08-23 ENCOUNTER — Emergency Department (HOSPITAL_COMMUNITY): Payer: MEDICAID

## 2013-08-23 ENCOUNTER — Encounter (HOSPITAL_COMMUNITY): Payer: Self-pay | Admitting: Emergency Medicine

## 2013-08-23 ENCOUNTER — Emergency Department (HOSPITAL_COMMUNITY): Payer: Self-pay

## 2013-08-23 DIAGNOSIS — Z79899 Other long term (current) drug therapy: Secondary | ICD-10-CM | POA: Insufficient documentation

## 2013-08-23 DIAGNOSIS — M791 Myalgia, unspecified site: Secondary | ICD-10-CM

## 2013-08-23 DIAGNOSIS — Z8669 Personal history of other diseases of the nervous system and sense organs: Secondary | ICD-10-CM | POA: Insufficient documentation

## 2013-08-23 DIAGNOSIS — R1031 Right lower quadrant pain: Secondary | ICD-10-CM | POA: Insufficient documentation

## 2013-08-23 DIAGNOSIS — F17200 Nicotine dependence, unspecified, uncomplicated: Secondary | ICD-10-CM | POA: Insufficient documentation

## 2013-08-23 DIAGNOSIS — IMO0001 Reserved for inherently not codable concepts without codable children: Secondary | ICD-10-CM | POA: Insufficient documentation

## 2013-08-23 DIAGNOSIS — Z8701 Personal history of pneumonia (recurrent): Secondary | ICD-10-CM | POA: Insufficient documentation

## 2013-08-23 DIAGNOSIS — R509 Fever, unspecified: Secondary | ICD-10-CM | POA: Insufficient documentation

## 2013-08-23 DIAGNOSIS — R5381 Other malaise: Secondary | ICD-10-CM | POA: Insufficient documentation

## 2013-08-23 DIAGNOSIS — R000 Tachycardia, unspecified: Secondary | ICD-10-CM | POA: Insufficient documentation

## 2013-08-23 DIAGNOSIS — R109 Unspecified abdominal pain: Secondary | ICD-10-CM

## 2013-08-23 LAB — CBC WITH DIFFERENTIAL/PLATELET
Basophils Absolute: 0 10*3/uL (ref 0.0–0.1)
Basophils Relative: 0 % (ref 0–1)
Eosinophils Absolute: 0.1 10*3/uL (ref 0.0–0.7)
Eosinophils Relative: 1 % (ref 0–5)
HCT: 39.2 % (ref 39.0–52.0)
Hemoglobin: 13.9 g/dL (ref 13.0–17.0)
Lymphocytes Relative: 20 % (ref 12–46)
Lymphs Abs: 2.5 10*3/uL (ref 0.7–4.0)
MCH: 31.5 pg (ref 26.0–34.0)
MCHC: 35.5 g/dL (ref 30.0–36.0)
MCV: 88.9 fL (ref 78.0–100.0)
Monocytes Absolute: 1 10*3/uL (ref 0.1–1.0)
Monocytes Relative: 8 % (ref 3–12)
Neutro Abs: 8.9 10*3/uL — ABNORMAL HIGH (ref 1.7–7.7)
Neutrophils Relative %: 71 % (ref 43–77)
Platelets: 280 10*3/uL (ref 150–400)
RBC: 4.41 MIL/uL (ref 4.22–5.81)
RDW: 12.3 % (ref 11.5–15.5)
WBC: 12.5 10*3/uL — ABNORMAL HIGH (ref 4.0–10.5)

## 2013-08-23 LAB — LIPASE, BLOOD: Lipase: 14 U/L (ref 11–59)

## 2013-08-23 LAB — URINALYSIS, ROUTINE W REFLEX MICROSCOPIC
Bilirubin Urine: NEGATIVE
Glucose, UA: NEGATIVE mg/dL
Hgb urine dipstick: NEGATIVE
Ketones, ur: NEGATIVE mg/dL
Leukocytes, UA: NEGATIVE
Nitrite: NEGATIVE
Protein, ur: NEGATIVE mg/dL
Specific Gravity, Urine: 1.031 — ABNORMAL HIGH (ref 1.005–1.030)
Urobilinogen, UA: 0.2 mg/dL (ref 0.0–1.0)
pH: 5.5 (ref 5.0–8.0)

## 2013-08-23 LAB — BASIC METABOLIC PANEL
BUN: 16 mg/dL (ref 6–23)
CO2: 21 mEq/L (ref 19–32)
Calcium: 9.4 mg/dL (ref 8.4–10.5)
Chloride: 99 mEq/L (ref 96–112)
Creatinine, Ser: 0.92 mg/dL (ref 0.50–1.35)
GFR calc Af Amer: >90 mL/min (ref >90)
GFR calc non Af Amer: >90 mL/min (ref >90)
Glucose, Bld: 106 mg/dL — ABNORMAL HIGH (ref 70–99)
Potassium: 4 mEq/L (ref 3.5–5.1)
Sodium: 132 mEq/L — ABNORMAL LOW (ref 135–145)

## 2013-08-23 LAB — CK: Total CK: 276 U/L — ABNORMAL HIGH (ref 7–232)

## 2013-08-23 MED ORDER — IOHEXOL 300 MG/ML  SOLN
100.0000 mL | Freq: Once | INTRAMUSCULAR | Status: AC | PRN
  Administered 2013-08-23: 100 mL via INTRAVENOUS

## 2013-08-23 MED ORDER — KETOROLAC TROMETHAMINE 15 MG/ML IJ SOLN
15.0000 mg | Freq: Once | INTRAMUSCULAR | Status: AC
  Administered 2013-08-23: 15 mg via INTRAVENOUS
  Filled 2013-08-23: qty 1

## 2013-08-23 MED ORDER — HYDROMORPHONE HCL PF 1 MG/ML IJ SOLN
1.0000 mg | Freq: Once | INTRAMUSCULAR | Status: AC
  Administered 2013-08-23: 1 mg via INTRAVENOUS
  Filled 2013-08-23: qty 1

## 2013-08-23 MED ORDER — IOHEXOL 300 MG/ML  SOLN
50.0000 mL | Freq: Once | INTRAMUSCULAR | Status: AC | PRN
  Administered 2013-08-23: 50 mL via ORAL

## 2013-08-23 MED ORDER — ACETAMINOPHEN 325 MG PO TABS
650.0000 mg | ORAL_TABLET | Freq: Once | ORAL | Status: AC
  Administered 2013-08-23: 650 mg via ORAL
  Filled 2013-08-23: qty 2

## 2013-08-23 MED ORDER — SODIUM CHLORIDE 0.9 % IV BOLUS (SEPSIS)
1000.0000 mL | Freq: Once | INTRAVENOUS | Status: AC
  Administered 2013-08-23: 1000 mL via INTRAVENOUS

## 2013-08-23 MED ORDER — ONDANSETRON HCL 4 MG/2ML IJ SOLN
4.0000 mg | Freq: Once | INTRAMUSCULAR | Status: AC
  Administered 2013-08-23: 4 mg via INTRAVENOUS
  Filled 2013-08-23: qty 2

## 2013-08-23 NOTE — ED Notes (Signed)
Per EMS patient was admitted at [facility] last week for pneumonia. Went back to work yesterday and has been feeling short of breath and weak.

## 2013-08-24 ENCOUNTER — Encounter (HOSPITAL_COMMUNITY): Payer: Self-pay | Admitting: Emergency Medicine

## 2013-08-24 ENCOUNTER — Emergency Department (HOSPITAL_COMMUNITY)
Admission: EM | Admit: 2013-08-24 | Discharge: 2013-08-24 | Disposition: A | Discharge status: Home / Self Care | Payer: Self-pay | Attending: Emergency Medicine | Admitting: Emergency Medicine

## 2013-08-24 DIAGNOSIS — R5381 Other malaise: Secondary | ICD-10-CM | POA: Insufficient documentation

## 2013-08-24 DIAGNOSIS — F17200 Nicotine dependence, unspecified, uncomplicated: Secondary | ICD-10-CM | POA: Insufficient documentation

## 2013-08-24 DIAGNOSIS — Z79899 Other long term (current) drug therapy: Secondary | ICD-10-CM | POA: Insufficient documentation

## 2013-08-24 DIAGNOSIS — R062 Wheezing: Secondary | ICD-10-CM | POA: Insufficient documentation

## 2013-08-24 DIAGNOSIS — R0989 Other specified symptoms and signs involving the circulatory and respiratory systems: Secondary | ICD-10-CM | POA: Insufficient documentation

## 2013-08-24 DIAGNOSIS — J129 Viral pneumonia, unspecified: Secondary | ICD-10-CM | POA: Insufficient documentation

## 2013-08-24 DIAGNOSIS — Z09 Encounter for follow-up examination after completed treatment for conditions other than malignant neoplasm: Secondary | ICD-10-CM | POA: Insufficient documentation

## 2013-08-24 LAB — HEPATIC FUNCTION PANEL
ALT: 14 U/L (ref 0–53)
AST: 24 U/L (ref 0–37)
Albumin: 3.5 g/dL (ref 3.5–5.2)
Alkaline Phosphatase: 56 U/L (ref 39–117)
Bilirubin, Direct: <0.1 mg/dL (ref 0.0–0.3)
Total Bilirubin: 0.3 mg/dL (ref 0.3–1.2)
Total Protein: 7.2 g/dL (ref 6.0–8.3)

## 2013-08-24 MED ORDER — ALBUTEROL SULFATE HFA 108 (90 BASE) MCG/ACT IN AERS: 1.0000 | INHALATION_SPRAY | Freq: Four times a day (QID) | RESPIRATORY_TRACT | Status: DC | PRN

## 2013-08-24 MED ORDER — OXYCODONE-ACETAMINOPHEN 5-325 MG PO TABS: 1.0000 | ORAL_TABLET | ORAL | Status: DC | PRN

## 2013-08-24 NOTE — ED Provider Notes (Signed)
CSN: [id number]     Arrival date & time 08/24/13  1453 History  This chart was scribed for Coral Ceo, PA, working with Flint Melter, MD by Blanchard Kelch, ED Scribe. This patient was seen in room TR07C/TR07C and the patient's care was started at 3:44 PM.    Chief Complaint  Patient presents with  . Follow-up    The history is provided by the patient. No language interpreter was used.    HPI Comments: ROGER KETTLES is a 37 y.o. male who presents to the Emergency Department for a check up following a pneumonia infection. He was admitted to the hospital on 10/6 and discharged without antibiotics on 10/9 for viral pneumonia.  He would like to return to work but needs a note that he is no longer contagious and ready to go back. He is feeling good currently except for mild myalgias. He was seen in [facility] yesterday (08/23/13) for abdominal pain and a fever (99.9 at home) and had a temperature of 102.4 (per rectal thermometer) but was discharged.  He had an extensive evaluation.  Since discharge, he denies difficulty breathing, chest pain, cough, fever, appetite changes, abdominal pain, or vomiting. He states he "feels ready to go back to work."      Past Medical History  Diagnosis Date  . Seizures     10 years ago  . MVHQIONG(295.2)    Past Surgical History  Procedure Laterality Date  . Right knee      plate with screws  . Ankle surgery      left with screws   Family History  Problem Relation Age of Onset  . Diabetes Father    History  Substance Use Topics  . Smoking status: Current Every Day Smoker -- 1.50 packs/day for 16 years  . Smokeless tobacco: Not on file  . Alcohol Use: Yes     Comment: ocassioinally    Review of Systems  Constitutional: Negative for fever, chills, activity change, appetite change and fatigue.  HENT: Negative for congestion, rhinorrhea and sore throat.   Eyes: Negative for visual disturbance.  Respiratory: Negative for cough and  shortness of breath.   Cardiovascular: Negative for chest pain and leg swelling.  Gastrointestinal: Negative for nausea, vomiting, abdominal pain, diarrhea and constipation.  Genitourinary: Negative for dysuria.  Musculoskeletal: Positive for myalgias. Negative for back pain, gait problem, neck pain and neck stiffness.  Skin: Negative for color change.  Neurological: Negative for dizziness, weakness, light-headedness, numbness and headaches.  All other systems reviewed and are negative.    Allergies  Review of patient's allergies indicates no known allergies.  Home Medications   Current Outpatient Rx  Name  Route  Sig  Dispense  Refill  . acetaminophen (TYLENOL) 500 MG tablet   Oral   Take 1,000 mg by mouth every 6 (six) hours as needed for fever.         . chlorpheniramine-HYDROcodone (TUSSIONEX) 10-8 MG/5ML LQCR   Oral   Take 5 mLs by mouth every 12 (twelve) hours as needed.   1 Bottle   0   . guaiFENesin (MUCINEX) 600 MG 12 hr tablet   Oral   Take 1 tablet (600 mg total) by mouth 2 (two) times daily.   14 tablet   0   . ibuprofen (ADVIL,MOTRIN) 200 MG tablet   Oral   Take 800 mg by mouth every 6 (six) hours as needed for fever.         Marland Kitchen  nicotine (NICODERM CQ - DOSED IN MG/24 HOURS) 21 mg/24hr patch   Transdermal   Place 1 patch onto the skin daily.   28 patch   0   . OVER THE COUNTER MEDICATION   Oral   Take 20 mLs by mouth at bedtime as needed (cough, fever, congestion). CVS cough/cold medicine         . oxyCODONE-acetaminophen (PERCOCET/ROXICET) 5-325 MG per tablet   Oral   Take 1-2 tablets by mouth every 4 (four) hours as needed for pain.   12 tablet   0    Triage Vitals: BP 99/58  Pulse 92  Temp(Src) 99.1 F (37.3 C) (Oral)  Resp 18  Wt 272 lb 12.8 oz (123.741 kg)  BMI 39.14 kg/m2  SpO2 97%  Filed Vitals:   08/24/13 1502 08/24/13 1559  BP: 99/58 108/78  Pulse: 92 93  Temp: 99.1 F (37.3 C)   TempSrc: Oral   Resp: 18 18  Weight: 272  lb 12.8 oz (123.741 kg)   SpO2: 97% 98%     Physical Exam  Nursing note and vitals reviewed. Constitutional: He is oriented to person, place, and time. He appears well-developed and well-nourished. No distress.  HENT:  Head: Normocephalic and atraumatic.  Right Ear: External ear normal.  Left Ear: External ear normal.  Nose: Nose normal.  Mouth/Throat: Oropharynx is clear and moist. No oropharyngeal exudate.  Eyes: Conjunctivae and EOM are normal. Pupils are equal, round, and reactive to light. Right eye exhibits no discharge. Left eye exhibits no discharge.  Neck: Normal range of motion. Neck supple. No tracheal deviation present.  Cardiovascular: Normal rate, regular rhythm, normal heart sounds and intact distal pulses.  Exam reveals no gallop and no friction rub.   No murmur heard. Dorsalis pedis pulses present bilaterally   Pulmonary/Chest: Effort normal. No respiratory distress. He has wheezes. He has no rales. He exhibits no tenderness.  Mild inspiratory and expiratory wheezing throughout.  Clears with coughing.    Abdominal: Soft. Bowel sounds are normal. He exhibits no distension and no mass. There is no tenderness. There is no rebound and no guarding.  Musculoskeletal: Normal range of motion. He exhibits no edema and no tenderness.  Neurological: He is alert and oriented to person, place, and time.  Skin: Skin is warm and dry.  Psychiatric: He has a normal mood and affect. His behavior is normal.    ED Course  Procedures (including critical care time)  DIAGNOSTIC STUDIES: Oxygen Saturation is 97% on room air, adequate by my interpretation.    COORDINATION OF CARE: 3:56 PM -Will write work note for patient to return to work Monday provided he is afebrile for 48 hrs prior to return. Patient verbalizes understanding and agrees with treatment plan.  Labs Review Labs Reviewed - No data to display Imaging Review   Dg Chest 2 View 08/23/2013   CLINICAL DATA:  Cough,  fever, smoker.  EXAM: CHEST  2 VIEW  COMPARISON:  Chest x-ray and CT chest 08/15/2013  FINDINGS: The heart size and mediastinal contours are within normal limits. Both lungs are clear. The visualized skeletal structures are unremarkable. There at Ground-glass densities noted on CT are difficult to appreciate on plain film. These may have improved in the interim. Compared with prior chest x-ray also from 10/06, the lung fields are clear.  IMPRESSION: No active cardiopulmonary disease.   Electronically Signed   By: Davonna Belling M.D.   On: 08/23/2013 18:45   Ct Abdomen Pelvis W Contrast  08/23/2013   CLINICAL DATA:  Fever and right lower quadrant pain  EXAM: CT ABDOMEN AND PELVIS WITH CONTRAST  TECHNIQUE: Multidetector CT imaging of the abdomen and pelvis was performed using the standard protocol following bolus administration of intravenous contrast.  CONTRAST:  omnipoaque 300  COMPARISON:  none  FINDINGS: Mild bibasilar atelectasis. Mild fatty infiltration of the liver. No focal hepatic abnormalities. Spleen is normal. There is a small splenule. Gallbladder is normal. Pancreas is normal. Adrenal glands are normal. Kidneys are normal. The aorta shows no dilatation, with mild calcification of the distal aorta. Reproductive organs and bladder appear normal. Bowel appears normal. Appendix is not identified. There is no inflammatory change in the right lower quadrant. Bone windows show no significant musculoskeletal abnormalities.  IMPRESSION: No acute findings.   Electronically Signed   By: Esperanza Heir M.D.   On: 08/23/2013 20:53    EKG Interpretation   None       Labs from yesterday 08/23/13  Results for orders placed during the hospital encounter of 08/23/13  CBC WITH DIFFERENTIAL      Result Value Range   WBC 12.5 (*) 4.0 - 10.5 K/uL   RBC 4.41  4.22 - 5.81 MIL/uL   Hemoglobin 13.9  13.0 - 17.0 g/dL   HCT 30.8  65.7 - 84.6 %   MCV 88.9  78.0 - 100.0 fL   MCH 31.5  26.0 - 34.0 pg   MCHC  35.5  30.0 - 36.0 g/dL   RDW 96.2  95.2 - 84.1 %   Platelets 280  150 - 400 K/uL   Neutrophils Relative % 71  43 - 77 %   Neutro Abs 8.9 (*) 1.7 - 7.7 K/uL   Lymphocytes Relative 20  12 - 46 %   Lymphs Abs 2.5  0.7 - 4.0 K/uL   Monocytes Relative 8  3 - 12 %   Monocytes Absolute 1.0  0.1 - 1.0 K/uL   Eosinophils Relative 1  0 - 5 %   Eosinophils Absolute 0.1  0.0 - 0.7 K/uL   Basophils Relative 0  0 - 1 %   Basophils Absolute 0.0  0.0 - 0.1 K/uL  BASIC METABOLIC PANEL      Result Value Range   Sodium 132 (*) 135 - 145 mEq/L   Potassium 4.0  3.5 - 5.1 mEq/L   Chloride 99  96 - 112 mEq/L   CO2 21  19 - 32 mEq/L   Glucose, Bld 106 (*) 70 - 99 mg/dL   BUN 16  6 - 23 mg/dL   Creatinine, Ser 3.24  0.50 - 1.35 mg/dL   Calcium 9.4  8.4 - 40.1 mg/dL   GFR calc non Af Amer >90  >90 mL/min   GFR calc Af Amer >90  >90 mL/min  URINALYSIS, ROUTINE W REFLEX MICROSCOPIC      Result Value Range   Color, Urine YELLOW  YELLOW   APPearance CLEAR  CLEAR   Specific Gravity, Urine 1.031 (*) 1.005 - 1.030   pH 5.5  5.0 - 8.0   Glucose, UA NEGATIVE  NEGATIVE mg/dL   Hgb urine dipstick NEGATIVE  NEGATIVE   Bilirubin Urine NEGATIVE  NEGATIVE   Ketones, ur NEGATIVE  NEGATIVE mg/dL   Protein, ur NEGATIVE  NEGATIVE mg/dL   Urobilinogen, UA 0.2  0.0 - 1.0 mg/dL   Nitrite NEGATIVE  NEGATIVE   Leukocytes, UA NEGATIVE  NEGATIVE  CK      Result Value Range  Total CK 276 (*) 7 - 232 U/L  HEPATIC FUNCTION PANEL      Result Value Range   Total Protein 7.2  6.0 - 8.3 g/dL   Albumin 3.5  3.5 - 5.2 g/dL   AST 24  0 - 37 U/L   ALT 14  0 - 53 U/L   Alkaline Phosphatase 56  39 - 117 U/L   Total Bilirubin 0.3  0.3 - 1.2 mg/dL   Bilirubin, Direct <4.7  0.0 - 0.3 mg/dL   Indirect Bilirubin NOT CALCULATED  0.3 - 0.9 mg/dL  LIPASE, BLOOD      Result Value Range   Lipase 14  11 - 59 U/L    MDM   1. Follow-up examination     BERGEN MAGNER is a 37 y.o. male who presents to the Emergency Department for a  check up following a pneumonia infection   Patient evaluated in the ED for a post-op examination.  He was recently admitted and discharged for viral pneumonia.  Yesterday (08/23/13), he was evaluated in the ED for a fever and abdominal pain.  He had a negative CT and work-up.  He was discharged.  Today, patient states he wants to go back to work and requires a doctor's note.  I wrote him a work excuse for return on 10/20 if he is afebrile for at least 48 hours.  I felt he required a few more days to rest as he was just seen in the ED for fever.  I reviewed his discharge instructions from previous admission and reinforced his follow-up care.  Patient expressed relief with albuterol treatments as an inpatient.  I prescribed him albuterol inhaler for symptomatic relief as he is still coughing and having intermittent wheezing.  Patient was afebrile in the ED, non-toxic, and had no complaints at this time.  Patient in agreement with discharge and plan.     Final impressions: 1. Follow-up examination     Luiz Iron PA-C   I personally performed the services described in this documentation, which was scribed in my presence. The recorded information has been reviewed and is accurate.     Jillyn Ledger, PA-C 08/28/13 2144

## 2013-08-24 NOTE — ED Notes (Signed)
Pt reports he was admitted to our hospital last week for pneumonia, pt attempted to return to work but they will not let him back to work with out a Dr.s note, NAD, pt denies any pain or complaints

## 2013-08-29 NOTE — ED Provider Notes (Signed)
Medical screening examination/treatment/procedure(s) were performed by non-physician practitioner and as supervising physician I was immediately available for consultation/collaboration.  Flint Melter, MD 08/29/13 1723

## 2013-08-30 NOTE — ED Provider Notes (Signed)
CSN: [id number]     Arrival date & time 08/23/13  1722 History   First MD Initiated Contact with Patient 08/23/13 1824     Chief Complaint  Patient presents with  . Shortness of Breath   (Consider location/radiation/quality/duration/timing/severity/associated sxs/prior Treatment) HPI  37 year old male with fever and general malaise. Patient was recently admitted for pneumonia, presumed to be viral. Was feeling better after discharge, although still fatigued. Symptoms began to worsen again today. He's been having abdominal pain, worse in his right lower quadrant. He feels this may be secondary to abdominal wall soreness from all the coughing her has been doing. No urinary complaints. No v/d. No rash. No sick contacts. Diffuse myalgias. Pt has extensive w/u during recent hospitalization which was pretty unremarkable.   Past Medical History  Diagnosis Date  . Seizures     10 years ago  . IONGEXBM(841.3)    Past Surgical History  Procedure Laterality Date  . Right knee      plate with screws  . Ankle surgery      left with screws   Family History  Problem Relation Age of Onset  . Diabetes Father    History  Substance Use Topics  . Smoking status: Current Every Day Smoker -- 1.50 packs/day for 16 years  . Smokeless tobacco: Not on file  . Alcohol Use: Yes     Comment: ocassioinally    Review of Systems  All systems reviewed and negative, other than as noted in HPI.   Allergies  Review of patient's allergies indicates no known allergies.  Home Medications   Current Outpatient Rx  Name  Route  Sig  Dispense  Refill  . acetaminophen (TYLENOL) 500 MG tablet   Oral   Take 1,000 mg by mouth every 6 (six) hours as needed for fever.         . chlorpheniramine-HYDROcodone (TUSSIONEX) 10-8 MG/5ML LQCR   Oral   Take 5 mLs by mouth every 12 (twelve) hours as needed.   1 Bottle   0   . guaiFENesin (MUCINEX) 600 MG 12 hr tablet   Oral   Take 1 tablet (600 mg total) by  mouth 2 (two) times daily.   14 tablet   0   . ibuprofen (ADVIL,MOTRIN) 200 MG tablet   Oral   Take 800 mg by mouth every 6 (six) hours as needed for fever.         . nicotine (NICODERM CQ - DOSED IN MG/24 HOURS) 21 mg/24hr patch   Transdermal   Place 1 patch onto the skin daily.   28 patch   0   . OVER THE COUNTER MEDICATION   Oral   Take 20 mLs by mouth at bedtime as needed (cough, fever, congestion). CVS cough/cold medicine         . albuterol (PROVENTIL HFA;VENTOLIN HFA) 108 (90 BASE) MCG/ACT inhaler   Inhalation   Inhale 1-2 puffs into the lungs every 6 (six) hours as needed for wheezing.   1 Inhaler   0   . oxyCODONE-acetaminophen (PERCOCET/ROXICET) 5-325 MG per tablet   Oral   Take 1-2 tablets by mouth every 4 (four) hours as needed for pain.   12 tablet   0    BP 108/71  Pulse 89  Temp(Src) 102.4 F (39.1 C) (Rectal)  Resp 14  SpO2 96% Physical Exam  Nursing note and vitals reviewed. Constitutional: He appears well-developed and well-nourished.  HENT:  Head: Normocephalic and atraumatic.  Eyes: Conjunctivae are  normal. Right eye exhibits no discharge. Left eye exhibits no discharge.  Neck: Neck supple.  Cardiovascular: Regular rhythm and normal heart sounds.  Exam reveals no gallop and no friction rub.   No murmur heard. Mild tachycardia  Pulmonary/Chest: Effort normal and breath sounds normal. No respiratory distress.  Abdominal: Soft. He exhibits no distension. There is tenderness. There is no rebound and no guarding.  Diffuse abdominal tenderness w/o g and auarding, worse in RLQ.   Genitourinary:  No cva tenderness  Musculoskeletal: He exhibits no edema and no tenderness.  Neurological: He is alert.  Skin: Skin is warm and dry. He is not diaphoretic.  Psychiatric: He has a normal mood and affect. His behavior is normal. Thought content normal.    ED Course  Procedures (including critical care time) Labs Review Labs Reviewed  CBC WITH  DIFFERENTIAL - Abnormal; Notable for the following:    WBC 12.5 (*)    Neutro Abs 8.9 (*)    All other components within normal limits  BASIC METABOLIC PANEL - Abnormal; Notable for the following:    Sodium 132 (*)    Glucose, Bld 106 (*)    All other components within normal limits  URINALYSIS, ROUTINE W REFLEX MICROSCOPIC - Abnormal; Notable for the following:    Specific Gravity, Urine 1.031 (*)    All other components within normal limits  CK - Abnormal; Notable for the following:    Total CK 276 (*)    All other components within normal limits  HEPATIC FUNCTION PANEL  LIPASE, BLOOD   Imaging Review No results found.  Dg Chest 2 View  08/23/2013   CLINICAL DATA:  Cough, fever, smoker.  EXAM: CHEST  2 VIEW  COMPARISON:  Chest x-ray and CT chest 08/15/2013  FINDINGS: The heart size and mediastinal contours are within normal limits. Both lungs are clear. The visualized skeletal structures are unremarkable. There at Ground-glass densities noted on CT are difficult to appreciate on plain film. These may have improved in the interim. Compared with prior chest x-ray also from 10/06, the lung fields are clear.  IMPRESSION: No active cardiopulmonary disease.   Electronically Signed   By: Davonna Belling M.D.   On: 08/23/2013 18:45   Dg Chest 2 View  08/15/2013   ADDENDUM REPORT: 08/15/2013 18:35  ADDENDUM: The CLINICAL DATA provided in the original report is incorrect. Please use the following:  CLINICAL DATA: CLINICAL DATA  Fever, cough and congestion with mid chest pain and shortness of breath.   Electronically Signed   By: Leanna Battles M.D.   On: 08/15/2013 18:35   08/15/2013   CLINICAL DATA:  Chest pain and back pain with shortness of breath. Elevated troponin and D-dimer levels.  EXAM: CHEST  2 VIEW  COMPARISON:  12/03/2011.  FINDINGS: Trachea is midline. Heart size normal. Possible added density in the right perihilar region, not visualized on the prior exam. Lungs are otherwise clear. No  pleural fluid. Mild anterior wedging of mid and lower thoracic vertebral bodies appears unchanged.  IMPRESSION: Possible added density in the right perihilar region. CT chest without contrast could be performed in further evaluation. If there is concern for pulmonary embolus, CTA chest with contrast is the preferred examination.  Electronically Signed: By: Leanna Battles M.D. On: 08/15/2013 18:30   Ct Angio Chest Pe W/cm &/or Wo Cm  08/15/2013   *RADIOLOGY REPORT*  Clinical Data: Chest pain, palpitations evaluate for PE  CT ANGIOGRAPHY CHEST  Technique:  Multidetector CT imaging  of the chest using the standard protocol during bolus administration of intravenous contrast. Multiplanar reconstructed images including MIPs were obtained and reviewed to evaluate the vascular anatomy.  Contrast: OMNIPAQUE IOHEXOL 350 MG/ML SOLN  Comparison: Chest x-ray obtained earlier today at 1800 hours  Findings:  Mediastinum: Residual thymic tissue in the anterior mediastinum. Nonspecific, not enlarged by CT criteria mediastinal and hilar lymph nodes.  Index low right paratracheal node measures 8 mm in short axis.  Probable small hiatal hernia.  Heart/Vascular: Adequate opacification of the pulmonary arteries to the segmental level.  Evaluation the more distal arteries limited secondary to contrast bolus timing and respiratory motion.  No central filling defect to suggest acute pulmonary embolus. Conventional three-vessel arch anatomy.  Atherosclerotic vascular calcifications noted along the left anterior descending coronary artery.  More detail region of coronary calcium is limited by non gated technique. Heart itself is within normal limits for size.  No pericardial effusion. The ascending thoracic aorta is ectatic at 4.1 cm.  Lungs/Pleura: Multi focal patchy areas of ground-glass attenuation airspace in a predominately bronchovascular distribution throughout the bilateral upper and to a lesser extent the lower lobes.   Trace dependent atelectasis in the lower lobes.  No pleural effusion.  Upper Abdomen: Diffuse low attenuation of the hepatic parenchyma suggests advanced hepatic steatosis.  Otherwise, limited imaging of the upper abdomen is unremarkable.  Bones: No acute fracture or aggressive appearing lytic or blastic osseous lesion.  IMPRESSION:  1.  Negative for acute pulmonary embolus to the segmental level. 2.  Multi-focal ground-glass attenuation opacities throughout both upper and, to a lesser extent, lower lobes consistent with an acute infectious or inflammatory process.  Atypical and viral infection should be considered given the distribution.  Alveolitis is another possibility.  3.  Early onset coronary artery disease as defined by the presence of calcified atherosclerotic plaque along the course of the left anterior descending coronary artery.  Recommend further clinical evaluation and risk factor stratification with initiation of dietary, lifestyle and pharmacologic modifications if clinically warranted.  4.  Ectatic ascending thoracic aorta measures up to 4.1 cm.  Query clinical history of systemic hypertension, or bicuspid aortic valve.  5.  Mediastinal lymph nodes are likely reactive.  6.  Advanced hepatic steatosis.   Original Report Authenticated By: Malachy Moan, M.D.   Ct Abdomen Pelvis W Contrast  08/23/2013   CLINICAL DATA:  Fever and right lower quadrant pain  EXAM: CT ABDOMEN AND PELVIS WITH CONTRAST  TECHNIQUE: Multidetector CT imaging of the abdomen and pelvis was performed using the standard protocol following bolus administration of intravenous contrast.  CONTRAST:  omnipoaque 300  COMPARISON:  none  FINDINGS: Mild bibasilar atelectasis. Mild fatty infiltration of the liver. No focal hepatic abnormalities. Spleen is normal. There is a small splenule. Gallbladder is normal. Pancreas is normal. Adrenal glands are normal. Kidneys are normal. The aorta shows no dilatation, with mild  calcification of the distal aorta. Reproductive organs and bladder appear normal. Bowel appears normal. Appendix is not identified. There is no inflammatory change in the right lower quadrant. Bone windows show no significant musculoskeletal abnormalities.  IMPRESSION: No acute findings.   Electronically Signed   By: Esperanza Heir M.D.   On: 08/23/2013 20:53   EKG Interpretation   None       MDM   1. Fever   2. Abdominal pain   3. Myalgia    37 year old male with continued fever and abdominal pain. Possibly continued symptoms of viral illness.  CXR w/o focal abnormality. CT w/o acute findings. Very may well be abdominal wall soreness. Plan continued rest and symptomatic tx. Pt needs to get a PCP.    Raeford Razor, MD 08/30/13 [phone]

## 2013-09-16 ENCOUNTER — Ambulatory Visit: Payer: Self-pay

## 2013-09-16 ENCOUNTER — Inpatient Hospital Stay: Payer: Self-pay

## 2014-02-01 ENCOUNTER — Emergency Department (HOSPITAL_COMMUNITY)
Admission: EM | Admit: 2014-02-01 | Discharge: 2014-02-01 | Disposition: A | Discharge status: Home / Self Care | Payer: No Typology Code available for payment source | Attending: Emergency Medicine | Admitting: Emergency Medicine

## 2014-02-01 ENCOUNTER — Encounter (HOSPITAL_COMMUNITY): Payer: Self-pay | Admitting: Emergency Medicine

## 2014-02-01 DIAGNOSIS — Z79899 Other long term (current) drug therapy: Secondary | ICD-10-CM | POA: Insufficient documentation

## 2014-02-01 DIAGNOSIS — F17200 Nicotine dependence, unspecified, uncomplicated: Secondary | ICD-10-CM | POA: Insufficient documentation

## 2014-02-01 DIAGNOSIS — W57XXXA Bitten or stung by nonvenomous insect and other nonvenomous arthropods, initial encounter: Secondary | ICD-10-CM | POA: Insufficient documentation

## 2014-02-01 DIAGNOSIS — Z8669 Personal history of other diseases of the nervous system and sense organs: Secondary | ICD-10-CM | POA: Insufficient documentation

## 2014-02-01 DIAGNOSIS — S60569A Insect bite (nonvenomous) of unspecified hand, initial encounter: Secondary | ICD-10-CM | POA: Insufficient documentation

## 2014-02-01 DIAGNOSIS — S60469A Insect bite (nonvenomous) of unspecified finger, initial encounter: Secondary | ICD-10-CM | POA: Insufficient documentation

## 2014-02-01 DIAGNOSIS — Y929 Unspecified place or not applicable: Secondary | ICD-10-CM | POA: Insufficient documentation

## 2014-02-01 DIAGNOSIS — Y9389 Activity, other specified: Secondary | ICD-10-CM | POA: Insufficient documentation

## 2014-02-01 NOTE — ED Provider Notes (Signed)
CSN: [id number]     Arrival date & time 02/01/14  1555 History   First MD Initiated Contact with Patient 02/01/14 1621     This chart was scribed for non-physician practitioner, Levander Campion PA-C working with Junius Argyle, MD by Arlan Organ, ED Scribe. This patient was seen in room TR06C/TR06C and the patient's care was started at 4:53 PM.   Chief Complaint  Patient presents with  . Hand Pain   The history is provided by the patient. No language interpreter was used.    HPI Comments: Dalton [name] is a 38 y.o. male who presents to the Emergency Department complaining of sudden onset R hand swelling x 1 day. He has also noted an insect bite to the area. He denies any recent injury or trauma. 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 He has not tried Benadryl. At this time he denies any pain to the area or SOB. No other concerns this visit.  Past Medical History  Diagnosis Date  . Seizures     10 years ago  . [id number](478.2)    Past Surgical History  Procedure Laterality Date  . Right knee      plate with screws  . Ankle surgery      left with screws   Family History  Problem Relation Age of Onset  . Diabetes Father    History  Substance Use Topics  . Smoking status: Current Every Day Smoker -- 1.50 packs/day for 16 years  . Smokeless tobacco: Not on file  . Alcohol Use: Yes     Comment: ocassioinally    Review of Systems  Constitutional: Negative for fever and chills.  Respiratory: Negative for shortness of breath.   Musculoskeletal:       Swelling to R hand  Skin: Positive for color change. Negative for wound.      Allergies  Review of patient's allergies indicates no known allergies.  Home Medications   Current Outpatient Rx  Name  Route  Sig  Dispense  Refill  . acetaminophen (TYLENOL) 500 MG tablet   Oral   Take 1,000 mg by mouth every 6 (six) hours as needed for fever.          Marland Kitchen albuterol (PROVENTIL HFA;VENTOLIN HFA) 108 (90 BASE) MCG/ACT inhaler   Inhalation   Inhale 1-2 puffs into the lungs every 6 (six) hours as needed for wheezing.   1 Inhaler   0   . chlorpheniramine-HYDROcodone (TUSSIONEX) 10-8 MG/5ML LQCR   Oral   Take 5 mLs by mouth every 12 (twelve) hours as needed.   1 Bottle   0   . guaiFENesin (MUCINEX) 600 MG 12 hr tablet   Oral   Take 1 tablet (600 mg total) by mouth 2 (two) times daily.   14 tablet   0   . ibuprofen (ADVIL,MOTRIN) 200 MG tablet   Oral   Take 800 mg by mouth every 6 (six) hours as needed for fever.         . nicotine (NICODERM CQ - DOSED IN MG/24 HOURS) 21 mg/24hr patch   Transdermal   Place 1 patch onto the skin daily.   28 patch   0   . OVER THE COUNTER MEDICATION   Oral   Take 20 mLs by mouth at bedtime as needed (cough, fever, congestion). [facility] cough/cold medicine         . oxyCODONE-acetaminophen (  PERCOCET/ROXICET) 5-325 MG per tablet   Oral   Take 1-2 tablets by mouth every 4 (four) hours as needed for pain.   12 tablet   0    Triage Vitals: BP 137/97  Pulse 81  Temp(Src) 98 F (36.7 C) (Oral)  Resp 16  Ht 5\' 10"  (1.778 m)  Wt 290 lb (131.543 kg)  BMI 41.61 kg/m2  SpO2 97%   Physical Exam  Nursing note and vitals reviewed. Constitutional: He is oriented to person, place, and time. He appears well-developed and well-nourished.  HENT:  Head: Normocephalic and atraumatic.  Eyes: EOM are normal.  Neck: Normal range of motion.  Cardiovascular: Normal rate.   Pulmonary/Chest: Effort normal.  Musculoskeletal: Normal range of motion.  Right hand moderately swollen and diffusely erythematous. No specific lesion or point tenderness. FROM all digits.   Neurological: He is alert and oriented to person, place, and time.  Skin: Skin is warm and dry.  He has a single circular lesion to 3rd finger that is slightly raised c/w insect bite.  Psychiatric: He has a normal mood and affect. His  behavior is normal.    ED Course  Procedures (including critical care time)  DIAGNOSTIC STUDIES: Oxygen Saturation is 97% on RA, Normal by my interpretation.    COORDINATION OF CARE: 4:54 PM-Discussed treatment plan with pt at bedside and pt agreed to plan.     Labs Review Labs Reviewed - No data to display Imaging Review No results found.   EKG Interpretation None      MDM   Final diagnoses:  None    1. Insect bite  Symptoms limited to hand swelling. No significant pain, no fever. He has a single circular lesion to 3rd finger so symptoms likely inflammatory secondary to mosquito as this has happened in the past. Stable for discharge.   I personally performed the services described in this documentation, which was scribed in my presence. The recorded information has been reviewed and is accurate.      Arnoldo HookerShari A Kazzandra Desaulniers, PA-C 02/11/14 2215

## 2014-02-01 NOTE — Discharge Instructions (Signed)
TAKE NAPROXEN AS YOUR PRESCRIPTION AT HOME DIRECTS. COOL COMPRESSES. KEEP HAND ELEVATED TO REDUCE SWELLING. RETURN HERE WITH ANY WORSENING SYMPTOMS OR NEW CONCERN.  Cryotherapy Cryotherapy means treatment with cold. Ice or gel packs can be used to reduce both pain and swelling. Ice is the most helpful within the first 24 to 48 hours after an injury or flareup from overusing a muscle or joint. Sprains, strains, spasms, burning pain, shooting pain, and aches can all be eased with ice. Ice can also be used when recovering from surgery. Ice is effective, has very few side effects, and is safe for most people to use. PRECAUTIONS  Ice is not a safe treatment option for people with:  Raynaud's phenomenon. This is a condition affecting small blood vessels in the extremities. Exposure to cold may cause your problems to return.  Cold hypersensitivity. There are many forms of cold hypersensitivity, including:  Cold urticaria. Red, itchy hives appear on the skin when the tissues begin to warm after being iced.  Cold erythema. This is a red, itchy rash caused by exposure to cold.  Cold hemoglobinuria. Red blood cells break down when the tissues begin to warm after being iced. The hemoglobin that carry oxygen are passed into the urine because they cannot combine with blood proteins fast enough.  Numbness or altered sensitivity in the area being iced. If you have any of the following conditions, do not use ice until you have discussed cryotherapy with your caregiver:  Heart conditions, such as arrhythmia, angina, or chronic heart disease.  High blood pressure.  Healing wounds or open skin in the area being iced.  Current infections.  Rheumatoid arthritis.  Poor circulation.  Diabetes. Ice slows the blood flow in the region it is applied. This is beneficial when trying to stop inflamed tissues from spreading irritating chemicals to surrounding tissues. However, if you expose your skin to cold  temperatures for too long or without the proper protection, you can damage your skin or nerves. Watch for signs of skin damage due to cold. HOME CARE INSTRUCTIONS Follow these tips to use ice and cold packs safely.  Place a dry or damp towel between the ice and skin. A damp towel will cool the skin more quickly, so you may need to shorten the time that the ice is used.  For a more rapid response, add gentle compression to the ice.  Ice for no more than 10 to 20 minutes at a time. The bonier the area you are icing, the less time it will take to get the benefits of ice.  Check your skin after 5 minutes to make sure there are no signs of a poor response to cold or skin damage.  Rest 20 minutes or more in between uses.  Once your skin is numb, you can end your treatment. You can test numbness by very lightly touching your skin. The touch should be so light that you do not see the skin dimple from the pressure of your fingertip. When using ice, most people will feel these normal sensations in this order: cold, burning, aching, and numbness.  Do not use ice on someone who cannot communicate their responses to pain, such as small children or people with dementia. HOW TO MAKE AN ICE PACK Ice packs are the most common way to use ice therapy. Other methods include ice massage, ice baths, and cryo-sprays. Muscle creams that cause a cold, tingly feeling do not offer the same benefits that ice offers and should  not be used as a substitute unless recommended by your caregiver. To make an ice pack, do one of the following:  Place crushed ice or a bag of frozen vegetables in a sealable plastic bag. Squeeze out the excess air. Place this bag inside another plastic bag. Slide the bag into a pillowcase or place a damp towel between your skin and the bag.  Mix 3 parts water with 1 part rubbing alcohol. Freeze the mixture in a sealable plastic bag. When you remove the mixture from the freezer, it will be slushy.  Squeeze out the excess air. Place this bag inside another plastic bag. Slide the bag into a pillowcase or place a damp towel between your skin and the bag. SEEK MEDICAL CARE IF:  You develop white spots on your skin. This may give the skin a blotchy (mottled) appearance.  Your skin turns blue or pale.  Your skin becomes waxy or hard.  Your swelling gets worse. MAKE SURE YOU:   Understand these instructions.  Will watch your condition.  Will get help right away if you are not doing well or get worse. Document Released: 06/23/2011 Document Revised: 01/19/2012 Document Reviewed: 06/23/2011 [facility] Patient Information 2014 [place], [place]. Insect Bite Mosquitoes, flies, fleas, bedbugs, and many other insects can bite. Insect bites are different from insect stings. A sting is when venom is injected into the skin. Some insect bites can transmit infectious diseases. SYMPTOMS  Insect bites usually turn red, swell, and itch for 2 to 4 days. They often go away on their own. TREATMENT  Your caregiver may prescribe antibiotic medicines if a bacterial infection develops in the bite. HOME CARE INSTRUCTIONS  Do not scratch the bite area.  Keep the bite area clean and dry. Wash the bite area thoroughly with soap and water.  Put ice or cool compresses on the bite area.  Put ice in a plastic bag.  Place a towel between your skin and the bag.  Leave the ice on for 20 minutes, 4 times a day for the first 2 to 3 days, or as directed.  You may apply a baking soda paste, cortisone cream, or calamine lotion to the bite area as directed by your caregiver. This can help reduce itching and swelling.  Only take over-the-counter or prescription medicines as directed by your caregiver.  If you are given antibiotics, take them as directed. Finish them even if you start to feel better. You may need a tetanus shot if:  You cannot remember when you had your last tetanus shot.  You have never had a  tetanus shot.  The injury broke your skin. If you get a tetanus shot, your arm may swell, get red, and feel warm to the touch. This is common and not a problem. If you need a tetanus shot and you choose not to have one, there is a rare chance of getting tetanus. Sickness from tetanus can be serious. SEEK IMMEDIATE MEDICAL CARE IF:   You have increased pain, redness, or swelling in the bite area.  You see a red line on the skin coming from the bite.  You have a fever.  You have joint pain.  You have a headache or neck pain.  You have unusual weakness.  You have a rash.  You have chest pain or shortness of breath.  You have abdominal pain, nausea, or vomiting.  You feel unusually tired or sleepy. MAKE SURE YOU:   Understand these instructions.  Will watch your condition.  Will get help right away if you are not doing well or get worse. Document Released: 12/04/2004 Document Revised: 01/19/2012 Document Reviewed: 05/28/2011 [facility] Patient Information 2014 [place], [place].

## 2014-02-01 NOTE — ED Notes (Signed)
Pt reporting this morning woke up and right hand was swollen. Denies injury. Sensation is intact. In NAD

## 2014-02-12 NOTE — ED Provider Notes (Signed)
Medical screening examination/treatment/procedure(s) were performed by non-physician practitioner and as supervising physician I was immediately available for consultation/collaboration.   EKG Interpretation None        Junius ArgyleForrest S Ginevra Tacker, MD 02/12/14 1204

## 2014-06-17 ENCOUNTER — Encounter (HOSPITAL_COMMUNITY): Payer: Self-pay | Admitting: Emergency Medicine

## 2014-06-17 ENCOUNTER — Emergency Department (HOSPITAL_COMMUNITY)
Admission: EM | Admit: 2014-06-17 | Discharge: 2014-06-17 | Disposition: A | Discharge status: Home / Self Care | Payer: No Typology Code available for payment source | Attending: Emergency Medicine | Admitting: Emergency Medicine

## 2014-06-17 DIAGNOSIS — T63484A Toxic effect of venom of other arthropod, undetermined, initial encounter: Secondary | ICD-10-CM

## 2014-06-17 DIAGNOSIS — W57XXXA Bitten or stung by nonvenomous insect and other nonvenomous arthropods, initial encounter: Secondary | ICD-10-CM

## 2014-06-17 DIAGNOSIS — T63461A Toxic effect of venom of wasps, accidental (unintentional), initial encounter: Secondary | ICD-10-CM | POA: Insufficient documentation

## 2014-06-17 DIAGNOSIS — R51 Headache: Secondary | ICD-10-CM | POA: Insufficient documentation

## 2014-06-17 DIAGNOSIS — T6391XA Toxic effect of contact with unspecified venomous animal, accidental (unintentional), initial encounter: Secondary | ICD-10-CM | POA: Insufficient documentation

## 2014-06-17 DIAGNOSIS — R569 Unspecified convulsions: Secondary | ICD-10-CM | POA: Insufficient documentation

## 2014-06-17 DIAGNOSIS — Z791 Long term (current) use of non-steroidal anti-inflammatories (NSAID): Secondary | ICD-10-CM | POA: Insufficient documentation

## 2014-06-17 DIAGNOSIS — Y939 Activity, unspecified: Secondary | ICD-10-CM | POA: Insufficient documentation

## 2014-06-17 DIAGNOSIS — F17200 Nicotine dependence, unspecified, uncomplicated: Secondary | ICD-10-CM | POA: Insufficient documentation

## 2014-06-17 DIAGNOSIS — Y929 Unspecified place or not applicable: Secondary | ICD-10-CM | POA: Insufficient documentation

## 2014-06-17 MED ORDER — LORATADINE 10 MG PO TABS
10.0000 mg | ORAL_TABLET | Freq: Once | ORAL | Status: AC
  Administered 2014-06-17: 10 mg via ORAL
  Filled 2014-06-17: qty 1

## 2014-06-17 MED ORDER — PREDNISONE 50 MG PO TABS
60.0000 mg | ORAL_TABLET | Freq: Once | ORAL | Status: AC
  Administered 2014-06-17: 20:00:00 60 mg via ORAL
  Filled 2014-06-17 (×2): qty 1

## 2014-06-17 MED ORDER — DEXAMETHASONE 6 MG PO TABS
ORAL_TABLET | ORAL | Status: DC
Start: 2014-06-17 — End: 2015-08-01

## 2014-06-17 NOTE — ED Notes (Signed)
Patient has been taking Aleve, Tylenol, Benadryl and icing site since last night.

## 2014-06-17 NOTE — ED Provider Notes (Signed)
CSN: [id number]     Arrival date & time 06/17/14  1906 History   First MD Initiated Contact with Patient 06/17/14 1924     Chief Complaint  Patient presents with  . Insect Bite     (Consider location/radiation/quality/duration/timing/severity/associated sxs/prior Treatment) HPI Comments: Patient presents to the emergency department with complaint of multiple bee stings. The patient states that on yesterday August 7 he was stung by bees on the right foot, and the right forearm and the right hand.. He noted swelling at the local sites. There was no shortness of breath reported. No hives reported. No difficulty with swallowing or breathing. No loss of consciousness reported. The patient presents to the emergency department because he says he has some swelling of his hand and his feet that it makes it difficult for him to do his job and some of his activities of daily living. The patient also complains of sensitivity and soreness to these areas. He requests assistance with this problem.  The history is provided by the patient.    Past Medical History  Diagnosis Date  . Seizures     10 years ago  . [id number](308.6)    Past Surgical History  Procedure Laterality Date  . Right knee      plate with screws  . Ankle surgery      left with screws   Family History  Problem Relation Age of Onset  . Diabetes Father    History  Substance Use Topics  . Smoking status: Current Every Day Smoker -- 1.50 packs/day for 16 years  . Smokeless tobacco: Not on file  . Alcohol Use: Yes     Comment: ocassioinally    Review of Systems  Constitutional: Negative for activity change.       All ROS Neg except as noted in HPI  HENT: Negative for nosebleeds.   Eyes: Negative for photophobia and discharge.  Respiratory: Negative for cough, shortness of breath and wheezing.   Cardiovascular: Negative for chest pain and palpitations.  Gastrointestinal: Negative for abdominal pain and blood in stool.   Genitourinary: Negative for dysuria, frequency and hematuria.  Musculoskeletal: Positive for arthralgias. Negative for back pain and neck pain.  Skin: Negative.   Neurological: Positive for seizures and headaches. Negative for dizziness and speech difficulty.  Psychiatric/Behavioral: Negative for hallucinations and confusion.      Allergies  Review of patient's allergies indicates no known allergies.  Home Medications   Prior to Admission medications   Medication Sig Start Date End Date Taking? Authorizing Provider  acetaminophen (TYLENOL) 500 MG tablet Take 1,000 mg by mouth every 6 (six) hours as needed for fever.   Yes Historical Provider, MD  diphenhydrAMINE (BENADRYL) 25 MG tablet Take 25 mg by mouth every 6 (six) hours as needed for itching or allergies.   Yes Historical Provider, MD  naproxen (NAPROSYN) 500 MG tablet Take 500 mg by mouth 2 (two) times daily as needed for mild pain or moderate pain.   Yes Historical Provider, MD   BP 121/85  Pulse 102  Temp(Src) 98.7 F (37.1 C) (Oral)  Resp 22  Ht 5\' 10"  (1.778 m)  Wt 280 lb (127.007 kg)  BMI 40.18 kg/m2  SpO2 97% Physical Exam  Nursing note and vitals reviewed. Constitutional: He is oriented to person, place, and time. He appears well-developed and well-nourished.  Non-toxic appearance.  HENT:  Head: Normocephalic.  Right Ear: Tympanic membrane and external ear normal.  Left Ear: Tympanic membrane and external ear normal.  Eyes: EOM and lids are normal. Pupils are equal, round, and reactive to light.  Neck: Normal range of motion. Neck supple. Carotid bruit is not present.  Cardiovascular: Normal rate, regular rhythm, normal heart sounds, intact distal pulses and normal pulses.   Pulmonary/Chest: Effort normal and breath sounds normal. No respiratory distress. He has no wheezes. He has no rales.  Patient speaks in complete sentences without problem.  Abdominal: Soft. Bowel sounds are normal. There is no tenderness.  There is no guarding.  Musculoskeletal: Normal range of motion.  There is swelling and mild redness of the dorsum of the right hand extending into the fingers.  There is redness and mild to moderate swelling of the forearm of the right upper extremity. There is no red streaking appreciated.  There is redness and swelling of the dorsum of the right foot extending into the toes.  Lymphadenopathy:       Head (right side): No submandibular adenopathy present.       Head (left side): No submandibular adenopathy present.    He has no cervical adenopathy.  Neurological: He is alert and oriented to person, place, and time. He has normal strength. No cranial nerve deficit or sensory deficit.  Skin: Skin is warm and dry.  Psychiatric: He has a normal mood and affect. His speech is normal.    ED Course  Procedures (including critical care time) Labs Review Labs Reviewed - No data to display  Imaging Review No results found.   EKG Interpretation None      MDM Pulse oximetry is 97% on room. Within normal limits by my interpretation. The patient speaks in complete sentences without problem. There is no rash or hives appreciated. There is localized swelling and tenderness at the bee sting sites. There is swelling at the bee sting sites. The plan at this time is for the patient to be placed on steroids, and antihistamines. The patient is to return to the emergency department if any changes, problems, or concerns.    Final diagnoses:  None    **I have reviewed nursing notes, vital signs, and all appropriate lab and imaging results for this patient.Kathie Dike*    Marquis Down M Chelan Heringer, PA-C 06/17/14 2003

## 2014-06-17 NOTE — Discharge Instructions (Signed)
Please use Claritin each morning, use Benadryl at bedtime for itching and antihistamines. Please use Decadron 2 times daily with food. Please see your primary physician, or return to the emergency department if any difficulty with breathing, facial swelling, or changes cause concern. Insect Bite Mosquitoes, flies, fleas, bedbugs, and other insects can bite. Insect bites are different from insect stings. The bite may be red, puffy (swollen), and itchy for 2 to 4 days. Most bites get better on their own. HOME CARE   Do not scratch the bite.  Keep the bite clean and dry. Wash the bite with soap and water.  Put ice on the bite.  Put ice in a plastic bag.  Place a towel between your skin and the bag.  Leave the ice on for 20 minutes, 4 times a day. Do this for the first 2 to 3 days, or as told by your doctor.  You may use medicated lotions or creams to lessen itching as told by your doctor.  Only take medicines as told by your doctor.  If you are given medicines (antibiotics), take them as told. Finish them even if you start to feel better. You may need a tetanus shot if:  You cannot remember when you had your last tetanus shot.  You have never had a tetanus shot.  The injury broke your skin. If you need a tetanus shot and you choose not to have one, you may get tetanus. Sickness from tetanus can be serious. GET HELP RIGHT AWAY IF:   You have more pain, redness, or puffiness.  You see a red line on the skin coming from the bite.  You have a fever.  You have joint pain.  You have a headache or neck pain.  You feel weak.  You have a rash.  You have chest pain, or you are short of breath.  You have belly (abdominal) pain.  You feel sick to your stomach (nauseous) or throw up (vomit).  You feel very tired or sleepy. MAKE SURE YOU:   Understand these instructions.  Will watch your condition.  Will get help right away if you are not doing well or get worse. Document  Released: 10/24/2000 Document Revised: 01/19/2012 Document Reviewed: 05/28/2011 [facility] Patient Information 2015 [place], [place]. This information is not intended to replace advice given to you by your health care provider. Make sure you discuss any questions you have with your health care provider.

## 2014-06-17 NOTE — ED Notes (Signed)
I was stung by bees yesterday on my right arm, right hand, and right foot per pt. Swelling noted to right hand and right foot. No trouble breathing, denies nausea, vomiting, and  Diarrhea. Very sensitive to touch.

## 2014-06-17 NOTE — ED Notes (Signed)
Patient with no complaints at this time. Respirations even and unlabored. Skin warm/dry. Discharge instructions reviewed with patient at this time. Patient given opportunity to voice concerns/ask questions. Patient discharged at this time and left Emergency Department with steady gait.   

## 2014-06-19 NOTE — ED Provider Notes (Signed)
Medical screening examination/treatment/procedure(s) were performed by non-physician practitioner and as supervising physician I was immediately available for consultation/collaboration.   EKG Interpretation None        Liani Caris, MD 06/19/14 0015 

## 2014-10-27 ENCOUNTER — Emergency Department (HOSPITAL_COMMUNITY)
Admission: EM | Admit: 2014-10-27 | Discharge: 2014-10-27 | Disposition: A | Discharge status: Home / Self Care | Payer: No Typology Code available for payment source | Attending: Emergency Medicine | Admitting: Emergency Medicine

## 2014-10-27 ENCOUNTER — Encounter (HOSPITAL_COMMUNITY): Payer: Self-pay | Admitting: Emergency Medicine

## 2014-10-27 DIAGNOSIS — Z720 Tobacco use: Secondary | ICD-10-CM | POA: Insufficient documentation

## 2014-10-27 DIAGNOSIS — I471 Supraventricular tachycardia: Secondary | ICD-10-CM

## 2014-10-27 DIAGNOSIS — Z8669 Personal history of other diseases of the nervous system and sense organs: Secondary | ICD-10-CM | POA: Insufficient documentation

## 2014-10-27 HISTORY — DX: Supraventricular tachycardia: I47.1

## 2014-10-27 HISTORY — DX: Supraventricular tachycardia, unspecified: I47.10

## 2014-10-27 LAB — CBC
HCT: 46.3 % (ref 39.0–52.0)
HEMOGLOBIN: 15.8 g/dL (ref 13.0–17.0)
MCH: 31.5 pg (ref 26.0–34.0)
MCHC: 34.1 g/dL (ref 30.0–36.0)
MCV: 92.2 fL (ref 78.0–100.0)
Platelets: 227 10*3/uL (ref 150–400)
RBC: 5.02 MIL/uL (ref 4.22–5.81)
RDW: 13 % (ref 11.5–15.5)
WBC: 7.9 10*3/uL (ref 4.0–10.5)

## 2014-10-27 LAB — BASIC METABOLIC PANEL
Anion gap: 18 — ABNORMAL HIGH (ref 5–15)
BUN: 13 mg/dL (ref 6–23)
CALCIUM: 9.3 mg/dL (ref 8.4–10.5)
CHLORIDE: 99 meq/L (ref 96–112)
CO2: 21 mEq/L (ref 19–32)
CREATININE: 0.81 mg/dL (ref 0.50–1.35)
GFR calc Af Amer: >90 mL/min (ref >90)
GFR calc non Af Amer: >90 mL/min (ref >90)
GLUCOSE: 185 mg/dL — AB (ref 70–99)
Potassium: 4 mEq/L (ref 3.7–5.3)
Sodium: 138 mEq/L (ref 137–147)

## 2014-10-27 LAB — PRO B NATRIURETIC PEPTIDE: Pro B Natriuretic peptide (BNP): 8.9 pg/mL (ref 0–125)

## 2014-10-27 LAB — I-STAT TROPONIN, ED: Troponin i, poc: 0 ng/mL (ref 0.00–0.08)

## 2014-10-27 NOTE — ED Notes (Addendum)
Pt arrives via EMS with c/o SVT, HR 210. Initial CP and SOB. Hx of tachycardia, can usually get resolved by coughing or clearing his throat. 6MG  adenosine, uneffective, 12 mg adenosine effective - down to 130 HR. CP and SOB resolved with HR decrease. 500 CC NS bolus. 18 G IV placed in L AC

## 2014-10-27 NOTE — ED Provider Notes (Signed)
CSN: [id number]     Arrival date & time 10/27/14  1920 History   First MD Initiated Contact with Patient 10/27/14 1937     Chief Complaint  Patient presents with  . Tachycardia     (Consider location/radiation/quality/duration/timing/severity/associated sxs/prior Treatment) HPI Comments: Patient presents to the ED with a chief complaint of heart palpitations.  Patient found to be tachy to 210 by EMS.  Patient given 18 of adenosine with rate control to the 120s.  Patient has a long history of the same.  He denies any chest pain, SOB, diaphoresis, n/v/d.  He has follow-up with cardiology.  He denies any pain at this time.  There are no aggravating factors.  The history is provided by the patient. No language interpreter was used.    Past Medical History  Diagnosis Date  . Seizures     10 years ago  . Headache(784.0)   . SVT (supraventricular tachycardia)    Past Surgical History  Procedure Laterality Date  . Right knee      plate with screws  . Ankle surgery      left with screws   Family History  Problem Relation Age of Onset  . Diabetes Father    History  Substance Use Topics  . Smoking status: Current Every Day Smoker -- 1.50 packs/day for 16 years  . Smokeless tobacco: Not on file  . Alcohol Use: Yes     Comment: ocassioinally    Review of Systems  Constitutional: Negative for fever and chills.  Respiratory: Negative for shortness of breath.   Cardiovascular: Positive for palpitations. Negative for chest pain and leg swelling.  Gastrointestinal: Negative for nausea, vomiting, diarrhea and constipation.  Genitourinary: Negative for dysuria.      Allergies  Review of patient's allergies indicates no known allergies.  Home Medications   Prior to Admission medications   Medication Sig Start Date End Date Taking? Authorizing Provider  acetaminophen (TYLENOL) 500 MG tablet Take 1,000 mg by mouth every 6 (six) hours as needed for fever.    Historical Provider, MD   dexamethasone (DECADRON) 6 MG tablet 1 po bid with food 06/17/14   Kathie DikeHobson M Bryant, PA-C  diphenhydrAMINE (BENADRYL) 25 MG tablet Take 25 mg by mouth every 6 (six) hours as needed for itching or allergies.    Historical Provider, MD  naproxen (NAPROSYN) 500 MG tablet Take 500 mg by mouth 2 (two) times daily as needed for mild pain or moderate pain.    Historical Provider, MD   BP 116/64 mmHg  Pulse 126  Temp(Src) 99.3 F (37.4 C)  Resp 24  Ht 5\' 11"  (1.803 m)  Wt 290 lb (131.543 kg)  BMI 40.46 kg/m2  SpO2 99% Physical Exam  Constitutional: He is oriented to person, place, and time. He appears well-developed and well-nourished.  HENT:  Head: Normocephalic and atraumatic.  Eyes: Conjunctivae and EOM are normal. Pupils are equal, round, and reactive to light. Right eye exhibits no discharge. Left eye exhibits no discharge. No scleral icterus.  Neck: Normal range of motion. Neck supple. No JVD present.  Cardiovascular: Regular rhythm and normal heart sounds.  Exam reveals no gallop and no friction rub.   No murmur heard. tachycardic  Pulmonary/Chest: Effort normal and breath sounds normal. No respiratory distress. He has no wheezes. He has no rales. He exhibits no tenderness.  Abdominal: Soft. He exhibits no distension and no mass. There is no tenderness. There is no rebound and no guarding.  Musculoskeletal: Normal range  of motion. He exhibits no edema or tenderness.  Neurological: He is alert and oriented to person, place, and time.  Skin: Skin is warm and dry.  Psychiatric: He has a normal mood and affect. His behavior is normal. Judgment and thought content normal.  Nursing note and vitals reviewed.   ED Course  Procedures (including critical care time) Results for orders placed or performed during the hospital encounter of 10/27/14  Basic metabolic panel  Result Value Ref Range   Sodium 138 137 - 147 mEq/L   Potassium 4.0 3.7 - 5.3 mEq/L   Chloride 99 96 - 112 mEq/L   CO2 21  19 - 32 mEq/L   Glucose, Bld 185 (H) 70 - 99 mg/dL   BUN 13 6 - 23 mg/dL   Creatinine, Ser 4.540.81 0.50 - 1.35 mg/dL   Calcium 9.3 8.4 - 09.810.5 mg/dL   GFR calc non Af Amer >90 >90 mL/min   GFR calc Af Amer >90 >90 mL/min   Anion gap 18 (H) 5 - 15  CBC  Result Value Ref Range   WBC 7.9 4.0 - 10.5 K/uL   RBC 5.02 4.22 - 5.81 MIL/uL   Hemoglobin 15.8 13.0 - 17.0 g/dL   HCT 11.946.3 14.739.0 - 82.952.0 %   MCV 92.2 78.0 - 100.0 fL   MCH 31.5 26.0 - 34.0 pg   MCHC 34.1 30.0 - 36.0 g/dL   RDW 56.213.0 13.011.5 - 86.515.5 %   Platelets 227 150 - 400 K/uL  Pro b natriuretic peptide (BNP) - ONLY if shortness of breath has been DOCUMENTED in THIS visit  Result Value Ref Range   Pro B Natriuretic peptide (BNP) 8.9 0 - 125 pg/mL  I-stat troponin, ED ([facility])  Result Value Ref Range   Troponin i, poc 0.00 0.00 - 0.08 ng/mL   Comment 3           No results found.   Imaging Review No results found.   EKG Interpretation   Date/Time:  Friday October 27 2014 19:43:16 EST Ventricular Rate:  118 PR Interval:  135 QRS Duration: 87 QT Interval:  314 QTC Calculation: 440 R Axis:   72 Text Interpretation:  Sinus tachycardia ` no acute change from prior x  rate faster Confirmed by Denton LankSTEINL  MD, Caryn BeeKEVIN ([id number]) on 10/27/2014 7:46:22  PM      MDM   Final diagnoses:  SVT (supraventricular tachycardia)    Patient with long history of SVT.  Rate controlled in the ED.  Patient feels well.  Care assumed by Dr. Denton LankSteinl.  Please see attached note.    Roxy Horsemanobert Leonardo Plaia, PA-C 10/28/14 1511  Suzi RootsKevin E Steinl, MD 10/30/14 [phone]

## 2014-10-27 NOTE — Discharge Instructions (Signed)
It was our pleasure to provide your ER care today - we hope that you feel better.  Follow up with your doctor/cardiologist in coming week - discuss possible further evaluation and/or rate control medication then.  Minimize caffeine use.  Return to ER if worse, new symptoms, persistent fast heart beat, faint, chest pain, trouble breathing, other concern.     Supraventricular Tachycardia Supraventricular tachycardia (SVT) is an abnormal heart rhythm (arrhythmia) that causes the heart to beat very fast (tachycardia). This kind of fast heartbeat originates in the upper chambers of the heart (atria). SVT can cause the heart to beat greater than 100 beats per minute. SVT can have a rapid burst of heartbeats. This can start and stop suddenly without warning and is called nonsustained. SVT can also be sustained, in which the heart beats at a continuous fast rate.  CAUSES  There can be different causes of SVT. Some of these include:  Heart valve problems such as mitral valve prolapse.  An enlarged heart (hypertrophic cardiomyopathy).  Congenital heart problems.  Heart inflammation (pericarditis).  Hyperthyroidism.  Low potassium or magnesium levels.  Caffeine.  Drug use such as cocaine, methamphetamines, or stimulants.  Some over-the-counter medicines such as:  Decongestants.  Diet medicines.  Herbal medicines. SYMPTOMS  Symptoms of SVT can vary. Symptoms depend on whether the SVT is sustained or nonsustained. You may experience:  No symptoms (asymptomatic).  An awareness of your heart beating rapidly (palpitations).  Shortness of breath.  Chest pain or pressure. If your blood pressure drops because of the SVT, you may experience:  Fainting or near fainting.  Weakness.  Dizziness. DIAGNOSIS  Different tests can be performed to diagnose SVT, such as:  An electrocardiogram (EKG). This is a painless test that records the electrical activity of your heart.  Holter  monitor. This is a 24 hour recording of your heart rhythm. You will be given a diary. Write down all symptoms that you have and what you were doing at the time you experienced symptoms.  Arrhythmia monitor. This is a small device that your wear for several weeks. It records the heart rhythm when you have symptoms.  Echocardiogram. This is an imaging test to help detect abnormal heart structure such as congenital abnormalities, heart valve problems, or heart enlargement.  Stress test. This test can help determine if the SVT is related to exercise.  Electrophysiology study (EPS). This is a procedure that evaluates your heart's electrical system and can help your caregiver find the cause of your SVT. TREATMENT  Treatment of SVT depends on the symptoms, how often it recurs, and whether there are any underlying heart problems.   If symptoms are rare and no other cardiac disease is present, no treatment may be needed.  Blood work may be done to check potassium, magnesium, and thyroid hormone levels to see if they are abnormal. If these levels are abnormal, treatment to correct the problems will occur. Medicines Your caregiver may use oral medicines to treat SVT. These medicines are given for long-term control of SVT. Medicines may be used alone or in combination with other treatments. These medicines work to slow nerve impulses in the heart muscle. These medicines can also be used to treat high blood pressure. Some of these medicines may include:  Calcium channel blockers.  Beta blockers.  Digoxin. Nonsurgical procedures Nonsurgical techniques may be used if oral medicines do not work. Some examples include:  Cardioversion. This technique uses either drugs or an electrical shock to restore a normal  heart rhythm.  Cardioversion drugs may be given through an intravenous (IV) line to help "reset" the heart rhythm.  In electrical cardioversion, the caregiver shocks your heart to stop its beat for  a split second. This helps to reset the heart to a normal rhythm.  Ablation. This procedure is done under mild sedation. High frequency radio wave energy is used to destroy the area of heart tissue responsible for the SVT. HOME CARE INSTRUCTIONS   Do not smoke.  Only take medicines prescribed by your caregiver. Check with your caregiver before using over-the-counter medicines.  Check with your caregiver about how much alcohol and caffeine (coffee, tea, colas, or chocolate) you may have.  It is very important to keep all follow-up referrals and appointments in order to properly manage this problem. SEEK IMMEDIATE MEDICAL CARE IF:  You have dizziness.  You faint or nearly faint.  You have shortness of breath.  You have chest pain or pressure.  You have sudden nausea or vomiting.  You have profuse sweating.  You are concerned about how long your symptoms last.  You are concerned about the frequency of your SVT episodes. If you have the above symptoms, call your local emergency services (911 in [place] immediately. Do not drive yourself to the hospital. MAKE SURE YOU:   Understand these instructions.  Will watch your condition.  Will get help right away if you are not doing well or get worse. Document Released: 10/27/2005 Document Revised: 01/19/2012 Document Reviewed: 02/08/2009 [facility] Patient Information 2015 [employer] [place]. This information is not intended to replace advice given to you by your health care provider. Make sure you discuss any questions you have with your health care provider.

## 2014-10-27 NOTE — ED Provider Notes (Signed)
Medical screening examination/treatment/procedure(s) were conducted as a shared visit with non-physician practitioner(s) and myself.  I personally evaluated the patient during the encounter.   EKG Interpretation   Date/Time:  Friday October 27 2014 19:43:16 EST Ventricular Rate:  118 PR Interval:  135 QRS Duration: 87 QT Interval:  314 QTC Calculation: 440 R Axis:   72 Text Interpretation:  Sinus tachycardia ` no acute change from prior x  rate faster Confirmed by Denton LankSTEINL  MD, Caryn BeeKEVIN ([id number]) on 10/27/2014 7:46:22  PM      Pt w svt, long hx same. Has been in sinus rhythm during ed stay.   Pt denies any current or prior chest pain or discomfort of any sort. No sob or unusual doe. No recent febrile illness.  Pt states he feels fine and is ready for d/c. Hr 96, rr 16. Pulse ox 98%.  Pt states he will [facility].      Suzi RootsKevin E Mar Walmer, MD 10/27/14 2123

## 2014-12-27 ENCOUNTER — Encounter (HOSPITAL_COMMUNITY): Payer: Self-pay | Admitting: Emergency Medicine

## 2014-12-27 DIAGNOSIS — R110 Nausea: Secondary | ICD-10-CM | POA: Insufficient documentation

## 2014-12-27 DIAGNOSIS — Z720 Tobacco use: Secondary | ICD-10-CM | POA: Insufficient documentation

## 2014-12-27 DIAGNOSIS — M549 Dorsalgia, unspecified: Secondary | ICD-10-CM | POA: Insufficient documentation

## 2014-12-27 DIAGNOSIS — R109 Unspecified abdominal pain: Secondary | ICD-10-CM | POA: Insufficient documentation

## 2014-12-27 LAB — URINALYSIS, ROUTINE W REFLEX MICROSCOPIC
Bilirubin Urine: NEGATIVE
Glucose, UA: NEGATIVE mg/dL
Hgb urine dipstick: NEGATIVE
KETONES UR: NEGATIVE mg/dL
LEUKOCYTES UA: NEGATIVE
NITRITE: NEGATIVE
Protein, ur: NEGATIVE mg/dL
Specific Gravity, Urine: 1.027 (ref 1.005–1.030)
Urobilinogen, UA: 1 mg/dL (ref 0.0–1.0)
pH: 6 (ref 5.0–8.0)

## 2014-12-27 LAB — CBC WITH DIFFERENTIAL/PLATELET
BASOS ABS: 0 10*3/uL (ref 0.0–0.1)
BASOS PCT: 0 % (ref 0–1)
EOS PCT: 3 % (ref 0–5)
Eosinophils Absolute: 0.2 10*3/uL (ref 0.0–0.7)
HEMATOCRIT: 43.2 % (ref 39.0–52.0)
HEMOGLOBIN: 14.5 g/dL (ref 13.0–17.0)
Lymphocytes Relative: 43 % (ref 12–46)
Lymphs Abs: 3.1 10*3/uL (ref 0.7–4.0)
MCH: 31.3 pg (ref 26.0–34.0)
MCHC: 33.6 g/dL (ref 30.0–36.0)
MCV: 93.3 fL (ref 78.0–100.0)
Monocytes Absolute: 0.5 10*3/uL (ref 0.1–1.0)
Monocytes Relative: 6 % (ref 3–12)
NEUTROS ABS: 3.5 10*3/uL (ref 1.7–7.7)
Neutrophils Relative %: 48 % (ref 43–77)
PLATELETS: 221 10*3/uL (ref 150–400)
RBC: 4.63 MIL/uL (ref 4.22–5.81)
RDW: 13.2 % (ref 11.5–15.5)
WBC: 7.3 10*3/uL (ref 4.0–10.5)

## 2014-12-27 LAB — COMPREHENSIVE METABOLIC PANEL
ALT: 22 U/L (ref 0–53)
AST: 29 U/L (ref 0–37)
Albumin: 3.8 g/dL (ref 3.5–5.2)
Alkaline Phosphatase: 75 U/L (ref 39–117)
Anion gap: 6 (ref 5–15)
BUN: 14 mg/dL (ref 6–23)
CALCIUM: 9.1 mg/dL (ref 8.4–10.5)
CHLORIDE: 102 mmol/L (ref 96–112)
CO2: 29 mmol/L (ref 19–32)
Creatinine, Ser: 0.91 mg/dL (ref 0.50–1.35)
GFR calc non Af Amer: >90 mL/min (ref >90)
GLUCOSE: 115 mg/dL — AB (ref 70–99)
POTASSIUM: 4.1 mmol/L (ref 3.5–5.1)
Sodium: 137 mmol/L (ref 135–145)
TOTAL PROTEIN: 6.9 g/dL (ref 6.0–8.3)
Total Bilirubin: 0.4 mg/dL (ref 0.3–1.2)

## 2014-12-27 LAB — LIPASE, BLOOD: LIPASE: 22 U/L (ref 11–59)

## 2014-12-27 NOTE — ED Notes (Signed)
Pt. reports mid back pain and mid abdominal pain with nausea onset 3 days ago , denies dysuria or hematuria , no fever or chills.

## 2014-12-28 ENCOUNTER — Emergency Department (HOSPITAL_COMMUNITY)
Admission: EM | Admit: 2014-12-28 | Discharge: 2014-12-28 | Disposition: A | Discharge status: Home / Self Care | Payer: Self-pay | Attending: Emergency Medicine | Admitting: Emergency Medicine

## 2014-12-28 ENCOUNTER — Emergency Department (HOSPITAL_COMMUNITY): Payer: Non-veteran care

## 2014-12-28 DIAGNOSIS — R109 Unspecified abdominal pain: Secondary | ICD-10-CM

## 2014-12-28 MED ORDER — FAMOTIDINE 20 MG PO TABS: 20.0000 mg | ORAL_TABLET | Freq: Two times a day (BID) | ORAL | Status: DC

## 2014-12-28 MED ORDER — FAMOTIDINE 20 MG PO TABS
20.0000 mg | ORAL_TABLET | Freq: Once | ORAL | Status: AC
  Administered 2014-12-28: 20 mg via ORAL
  Filled 2014-12-28: qty 1

## 2014-12-28 MED ORDER — GI COCKTAIL ~~LOC~~
30.0000 mL | Freq: Once | ORAL | Status: AC
  Administered 2014-12-28: 30 mL via ORAL
  Filled 2014-12-28: qty 30

## 2014-12-28 MED ORDER — HYDROCODONE-ACETAMINOPHEN 5-325 MG PO TABS
2.0000 | ORAL_TABLET | Freq: Once | ORAL | Status: AC
  Administered 2014-12-28: 2 via ORAL
  Filled 2014-12-28: qty 2

## 2014-12-28 MED ORDER — SUCRALFATE 1 G PO TABS: 1.0000 g | ORAL_TABLET | Freq: Three times a day (TID) | ORAL | Status: DC

## 2014-12-28 NOTE — ED Provider Notes (Signed)
CSN: [id number]     Arrival date & time 12/27/14  2243 History  This chart was scribed for Vida Roller, MD by Bronson Curb, ED Scribe. This patient was seen in room D33C/D33C and the patient's care was started at 12:49 AM.   Chief Complaint  Patient presents with  . Abdominal Pain  . Back Pain    The history is provided by the patient. No language interpreter was used.     HPI Comments: Dalton [name] is a 39 y.o. male, with history of seizures, who presents to the Emergency Department complaining of constant, waxing and waning, upper abdomen that radiates to the mid back onset 3 days ago. There is associated nausea and pressure in the abdomen that is worse when eating or standing up. He also states that he is unable to rest comfortably through the night due to his pain. Patient suspects his symptoms are related to his gallbladder. He reports history of GERD symptoms. Patient takes naproxen or Vicodin regularly, however, he reports this did not significantly relieve his abdominal pain. Patient denies fever, chills, dysuria, hematuria. He denies use of aspirin or BC powder. He denies significant caffeine intake. Patient is a current 1.5ppd smoker and reports history of occasional EtOH consumption.   Past Medical History  Diagnosis Date  . Seizures     10 years ago  . Headache(784.0)   . SVT (supraventricular tachycardia)    Past Surgical History  Procedure Laterality Date  . Right knee      plate with screws  . Ankle surgery      left with screws   Family History  Problem Relation Age of Onset  . Diabetes Father    History  Substance Use Topics  . Smoking status: Current Every Day Smoker -- 1.50 packs/day for 16 years  . Smokeless tobacco: Not on file  . Alcohol Use: Yes     Comment: ocassioinally    Review of Systems  Gastrointestinal: Positive for nausea and abdominal pain.  Musculoskeletal: Positive for back pain.  All other systems reviewed and are  negative.     Allergies  Review of patient's allergies indicates no known allergies.  Home Medications   Prior to Admission medications   Medication Sig Start Date End Date Taking? Authorizing Provider  HYDROcodone-acetaminophen (NORCO/VICODIN) 5-325 MG per tablet Take 1 tablet by mouth every 6 (six) hours as needed for moderate pain.   Yes Historical Provider, MD  naproxen (NAPROSYN) 500 MG tablet Take 500 mg by mouth 2 (two) times daily as needed for mild pain or moderate pain.   Yes Historical Provider, MD  acetaminophen (TYLENOL) 500 MG tablet Take 1,000 mg by mouth every 6 (six) hours as needed for fever.    Historical Provider, MD  dexamethasone (DECADRON) 6 MG tablet 1 po bid with food Patient not taking: Reported on 12/28/2014 06/17/14   Kathie Dike, PA-C  diphenhydrAMINE (BENADRYL) 25 MG tablet Take 25 mg by mouth every 6 (six) hours as needed for itching or allergies.    Historical Provider, MD  famotidine (PEPCID) 20 MG tablet Take 1 tablet (20 mg total) by mouth 2 (two) times daily. 12/28/14   Vida Roller, MD  sucralfate (CARAFATE) 1 G tablet Take 1 tablet (1 g total) by mouth 4 (four) times daily -  with meals and at bedtime. 12/28/14   Vida Roller, MD   Triage Vitals: BP 146/103 mmHg  Pulse 91  Temp(Src) 98.4 F (36.9 C) (Oral)  Resp 16  SpO2 99%  Physical Exam  Constitutional: He appears well-developed and well-nourished. No distress.  HENT:  Head: Normocephalic and atraumatic.  Mouth/Throat: Oropharynx is clear and moist. No oropharyngeal exudate.  Eyes: Conjunctivae and EOM are normal. Pupils are equal, round, and reactive to light. Right eye exhibits no discharge. Left eye exhibits no discharge. No scleral icterus.  Neck: Normal range of motion. Neck supple. No JVD present. No thyromegaly present.  Cardiovascular: Normal rate, regular rhythm, normal heart sounds and intact distal pulses.  Exam reveals no gallop and no friction rub.   No murmur  heard. Pulmonary/Chest: Effort normal and breath sounds normal. No respiratory distress. He has no wheezes. He has no rales.  Abdominal: Soft. Bowel sounds are normal. He exhibits no distension and no mass. There is tenderness in the right upper quadrant and epigastric area. There is negative Murphy's sign.  Mild epigastric and RUQ tenderness.  No murphy's sign.  Musculoskeletal: Normal range of motion. He exhibits no edema or tenderness.  Lymphadenopathy:    He has no cervical adenopathy.  Neurological: He is alert. Coordination normal.  Skin: Skin is warm and dry. No rash noted. No erythema.  Psychiatric: He has a normal mood and affect. His behavior is normal.  Nursing note and vitals reviewed.   ED Course  Procedures (including critical care time)  DIAGNOSTIC STUDIES: Oxygen Saturation is 99% on room air, normal by my interpretation.    COORDINATION OF CARE: At 280059 Discussed treatment plan with patient which includes GI cocktail. Labs reviewed, normal CBC, CMP and lipase. US ordered to r/u chole. Counseled patient on stopping naproxen. Patient agrees.   Labs Review Labs Reviewed  COMPREHENSIVE METABOLIC PANEL - Abnormal; Notable for the following:    Glucose, Bld 115 (*)    All other components within normal limits  CBC WITH DIFFERENTIAL/PLATELET  LIPASE, BLOOD  URINALYSIS, ROUTINE W REFLEX MICROSCOPIC    Imaging Review [place] Abdomen Complete  12/28/2014   CLINICAL DATA:  Abdomen pain  EXAM: ULTRASOUND ABDOMEN COMPLETE  COMPARISON:  CT 08/23/2013  FINDINGS: Gallbladder: Contracted. No calculi are evident. Gallbladder wall does not appear to be significantly thickened. No pericholecystic fluid. The patient was not tender over the gallbladder.  Common bile duct: Diameter: 5.6 mm, normal.  Liver: There is generalized increased echogenicity of the liver, likely due to fatty infiltration.  IVC: No abnormality visualized.  Pancreas: Not well seen.  Spleen: Size and appearance within  normal limits.  Right Kidney: Length: 12.7 cm. Echogenicity within normal limits. No mass or hydronephrosis visualized.  Left Kidney: Length: 12.2 cm. Echogenicity within normal limits. No mass or hydronephrosis visualized.  Abdominal aorta: No aneurysm visualized.  Other findings: None.  IMPRESSION: Dense liver, likely due to fatty infiltration. Nondiagnostic views of the pancreas. Contracted gallbladder, appearing grossly normal.   Electronically Signed   By: Ellery Plunkaniel R Mitchell M.D.   On: 12/28/2014 03:14      MDM   Final diagnoses:  Abdominal pain    Well-appearing, soft and minimally tender abdomen, labs and ultrasound nondiagnostic, patient informed of fatty liver, need for close follow-up, medications will be given as below. Doubt acute cholecystitis. Patient cautioned against NSAID use. He expresses his understanding.  I personally performed the services described in this documentation, which was scribed in my presence. The recorded information has been reviewed and is accurate.     Meds given in ED:  Medications  gi cocktail (Maalox,Lidocaine,Donnatal) (30 mLs Oral Given 12/28/14 0113)  famotidine (PEPCID)  tablet 20 mg (20 mg Oral Given 12/28/14 0113)  HYDROcodone-acetaminophen (NORCO/VICODIN) 5-325 MG per tablet 2 tablet (2 tablets Oral Given 12/28/14 0202)    New Prescriptions   FAMOTIDINE (PEPCID) 20 MG TABLET    Take 1 tablet (20 mg total) by mouth 2 (two) times daily.   SUCRALFATE (CARAFATE) 1 G TABLET    Take 1 tablet (1 g total) by mouth 4 (four) times daily -  with meals and at bedtime.       Vida Roller, MD 12/28/14 [phone]

## 2014-12-28 NOTE — Discharge Instructions (Signed)
Your testing showed that you have a likely fatty liver - this is not the cause of your symptoms - all other tests were non diagnostic - your gall bladder looked normal - start pepcid daily.  carafate with meals.  RESOURCE GUIDE  Chronic Pain Problems: Contact [facility]  [phone] Patients need to be referred by their primary care doctor.  Insufficient Money for Medicine: Contact [employer] Way:  call "211."   No Primary Care Doctor: - Call Health Connect  [phone] - can help you locate a primary care doctor that  accepts your insurance, provides certain services, etc. - Physician Referral Service- [phone]  Agencies that provide inexpensive medical care: - [facility]  [phone] - [facility]  [phone] - [facility]  [phone] - Women's Clinic  [phone] - Planned Parenthood  [phone] [facility]  [phone]  Medicaid-accepting [facility] Providers: - [facility]- [address]  [phone], Mon-Fri 9am-7pm, Sat 9am-1pm - [facility]- [address] [place], [address] [place]  [phone] - [facility]- [address], [address] [place]  [phone] [facility]- [address]  [phone] - Renaye Rakers- [address] [place], [address], [phone]  Only accepts [place] patients after they have their name  applied to their card  Self Pay (no insurance) in [place]: - Sickle Cell Patients: Dr Willey Blade, [facility]  [address] [place], [phone] - [facility]- [address] [place]  [phone]       [facility]- [address]       -     [facility]- see information above (Speak to [employer] if you do not have insurance)       -  [facility]- [address],  [phone]       -  [facility]- [address],  [phone]       -  Dr Julio Sicks-  [address], [place], [phone]       -  [facility] - [address] [phone]       -  [facility]- [address] [phone], also [address], [phone]       -    [facility]- [address] [place], [phone], 1st & 3rd Saturday        every month, 10am-1pm  [facility] [address] [place], [place] [address] [phone]  [facility] [address] [place], [place] [address] [phone]  1) Find a Doctor and Pay Out of Pocket Although you won't have to find out who is covered by your insurance plan, it is a good idea to ask around and get recommendations. You will then need to call the office and see if the doctor you have chosen will accept you as a new patient and what types of options they offer for patients who are self-pay. Some doctors offer discounts or will set up payment plans for their patients who do not have insurance, but you will need to ask so you aren't surprised when you get to your appointment.  2) Contact Your Local Health Department Not all health departments have doctors that can see  patients for sick visits, but many do, so it is worth a call to see if yours does. If you don't know where your local health department is, you can check in your phone book. The CDC also has a tool to help you locate your state's health department, and many state websites also have listings of all of their local health departments.  3) Find a Walk-in Clinic If your illness is not likely to be very severe or complicated, you may want to try a walk in clinic. These are popping up all over the country in pharmacies, drugstores, and shopping centers. They're usually staffed by nurse practitioners or physician assistants that have been trained to treat common illnesses and complaints. They're usually fairly quick and inexpensive. However, if  you have serious medical issues or chronic medical problems, these are probably not your best option  STD Testing - [facility] [place], STD Clinic, [address], [place], phone [phone] or [phone].  Monday - Friday, call for an appointment. [facility]- [place] [facility] [place], STD Clinic, [place], [place], phone [phone] or [phone].  Monday - Friday, call for an appointment.  Abuse/Neglect: [facility]- [place] Child Abuse Hotline [phone]) 827-4253(336) (905)228-8637 [facility]- [place] Child Abuse Hotline 548-135-9577249-468-8400 (After Hours)  Emergency Shelter:  [facility] [phone]) [phone]  Maternity Homes: - [facility] [phone]) [phone] - [facility] [phone]  MRSA Hotline #:   985-138-6630779-267-2743  Dental Assistance If unable to pay or uninsured, contact:  [facility] to become qualified for the adult dental clinic.  Patients with Medicaid: [facility] [phone], [phone] [address], [phone]  If unable to pay, or uninsured, contact [facility] [phone] in [place], [phone] in [facility]) to become qualified for the adult dental clinic  [facility] [address] [place], [place] [address] [phone] [email or web address]  Other [facility] Services: - [facility]- [address], [place], [place], [id number], [phone], [phone], 2nd and 4th Thursday of the month at 6:30am.  10 clients each day by appointment, can sometimes see walk-in patients if someone does not show for an appointment. [facility]- [address], [place], [place], [phone], [phone] - [facility]- [address], [place], [place], [address], [phone] - [facility]- [phone] [facility] [place]- [phone] [phone]- [phone] -

## 2015-02-11 ENCOUNTER — Emergency Department (HOSPITAL_COMMUNITY)
Admission: EM | Admit: 2015-02-11 | Discharge: 2015-02-11 | Disposition: A | Discharge status: Home / Self Care | Payer: Non-veteran care | Attending: Emergency Medicine | Admitting: Emergency Medicine

## 2015-02-11 ENCOUNTER — Emergency Department (HOSPITAL_COMMUNITY): Payer: Non-veteran care

## 2015-02-11 ENCOUNTER — Encounter (HOSPITAL_COMMUNITY): Payer: Self-pay

## 2015-02-11 DIAGNOSIS — Z8669 Personal history of other diseases of the nervous system and sense organs: Secondary | ICD-10-CM | POA: Insufficient documentation

## 2015-02-11 DIAGNOSIS — Z8679 Personal history of other diseases of the circulatory system: Secondary | ICD-10-CM | POA: Insufficient documentation

## 2015-02-11 DIAGNOSIS — Z791 Long term (current) use of non-steroidal anti-inflammatories (NSAID): Secondary | ICD-10-CM | POA: Insufficient documentation

## 2015-02-11 DIAGNOSIS — Z79899 Other long term (current) drug therapy: Secondary | ICD-10-CM | POA: Insufficient documentation

## 2015-02-11 DIAGNOSIS — J069 Acute upper respiratory infection, unspecified: Secondary | ICD-10-CM | POA: Insufficient documentation

## 2015-02-11 DIAGNOSIS — Z720 Tobacco use: Secondary | ICD-10-CM | POA: Insufficient documentation

## 2015-02-11 LAB — RAPID STREP SCREEN (MED CTR MEBANE ONLY): STREPTOCOCCUS, GROUP A SCREEN (DIRECT): NEGATIVE

## 2015-02-11 MED ORDER — BENZONATATE 100 MG PO CAPS: 100.0000 mg | ORAL_CAPSULE | Freq: Three times a day (TID) | ORAL | Status: DC

## 2015-02-11 MED ORDER — IPRATROPIUM-ALBUTEROL 0.5-2.5 (3) MG/3ML IN SOLN
3.0000 mL | Freq: Once | RESPIRATORY_TRACT | Status: AC
  Administered 2015-02-11: 3 mL via RESPIRATORY_TRACT
  Filled 2015-02-11: qty 3

## 2015-02-11 MED ORDER — PREDNISONE (PAK) 10 MG PO TABS: ORAL_TABLET | Freq: Four times a day (QID) | ORAL | Status: DC

## 2015-02-11 NOTE — ED Provider Notes (Signed)
CSN: [id number]     Arrival date & time 02/11/15  1259 History   First MD Initiated Contact with Patient 02/11/15 1331     Chief Complaint  Patient presents with  . URI     (Consider location/radiation/quality/duration/timing/severity/associated sxs/prior Treatment) Patient is a 39 y.o. male presenting with URI. The history is provided by the patient. No language interpreter was used.  URI Presenting symptoms: rhinorrhea   Presenting symptoms: no congestion   Associated symptoms: wheezing    Mr. Brown Humanerano is a 39 y.o white male with a history of headaches and chronic bronchitis who presents today with cough and sore throat for the past two days.  He states the cough is productive with clear sputum.  Nothing makes his symptoms better or worse. He is in no pain now. He denies any fever, chills, ear pain, difficulty swallowing, chest pain, or abdominal pain. He smokes 1ppd.   Past Medical History  Diagnosis Date  . Seizures     10 years ago  . Headache(784.0)   . SVT (supraventricular tachycardia)    Past Surgical History  Procedure Laterality Date  . Right knee      plate with screws  . Ankle surgery      left with screws   Family History  Problem Relation Age of Onset  . Diabetes Father    History  Substance Use Topics  . Smoking status: Current Every Day Smoker -- 1.50 packs/day for 16 years  . Smokeless tobacco: Not on file  . Alcohol Use: Yes     Comment: ocassioinally    Review of Systems  HENT: Positive for rhinorrhea. Negative for congestion, sinus pressure and voice change.   Respiratory: Positive for wheezing.   All other systems reviewed and are negative.     Allergies  Review of patient's allergies indicates no known allergies.  Home Medications   Prior to Admission medications   Medication Sig Start Date End Date Taking? Authorizing Provider  acetaminophen (TYLENOL) 500 MG tablet Take 1,000 mg by mouth every 6 (six) hours as needed for fever.     Historical Provider, MD  benzonatate (TESSALON) 100 MG capsule Take 1 capsule (100 mg total) by mouth every 8 (eight) hours. 02/11/15   Karron Goens Patel-Mills, PA-C  dexamethasone (DECADRON) 6 MG tablet 1 po bid with food Patient not taking: Reported on 12/28/2014 06/17/14   Ivery QualeHobson Bryant, PA-C  diphenhydrAMINE (BENADRYL) 25 MG tablet Take 25 mg by mouth every 6 (six) hours as needed for itching or allergies.    Historical Provider, MD  famotidine (PEPCID) 20 MG tablet Take 1 tablet (20 mg total) by mouth 2 (two) times daily. 12/28/14   Eber HongBrian Miller, MD  HYDROcodone-acetaminophen (NORCO/VICODIN) 5-325 MG per tablet Take 1 tablet by mouth every 6 (six) hours as needed for moderate pain.    Historical Provider, MD  naproxen (NAPROSYN) 500 MG tablet Take 500 mg by mouth 2 (two) times daily as needed for mild pain or moderate pain.    Historical Provider, MD  predniSONE (STERAPRED UNI-PAK) 10 MG tablet Take by mouth taper from 4 doses each day to 1 dose and stop. 02/11/15   Ezana Hubbert Patel-Mills, PA-C  sucralfate (CARAFATE) 1 G tablet Take 1 tablet (1 g total) by mouth 4 (four) times daily -  with meals and at bedtime. 12/28/14   Eber HongBrian Miller, MD   BP 115/77 mmHg  Pulse 99  Temp(Src) 98.1 F (36.7 C) (Oral)  Resp 18  SpO2 99% Physical Exam  Constitutional: He is oriented to person, place, and time. He appears well-developed and well-nourished.  HENT:  Head: Normocephalic and atraumatic.  Right Ear: External ear normal.  Left Ear: External ear normal.  Nose: Nose normal.  Mouth/Throat: Uvula is midline and mucous membranes are normal. No uvula swelling. Posterior oropharyngeal erythema present. No oropharyngeal exudate, posterior oropharyngeal edema or tonsillar abscesses.  Eyes: Conjunctivae are normal.  Neck: Normal range of motion. Neck supple.  Cardiovascular: Normal rate, regular rhythm and normal heart sounds.   Pulmonary/Chest: Effort normal. He has wheezes in the right middle field, the right lower  field, the left middle field and the left lower field.  Abdominal: Soft. There is no tenderness.  Musculoskeletal: Normal range of motion.  Neurological: He is alert and oriented to person, place, and time.  Skin: Skin is warm and dry.  Nursing note and vitals reviewed.   ED Course  Procedures (including critical care time) Labs Review Labs Reviewed  RAPID STREP SCREEN  CULTURE, GROUP A STREP    Imaging Review Dg Chest 2 View  02/11/2015   CLINICAL DATA:  Cough.  Congestion.  Sore throat.  Fever.  Smoker.  EXAM: CHEST  2 VIEW  COMPARISON:  08/23/2013  FINDINGS: Right lower lobe accessory fissure, as before. Midline trachea. Normal heart size and mediastinal contours. No pleural effusion or pneumothorax. Clear lungs.  IMPRESSION: No acute cardiopulmonary disease.   Electronically Signed   By: Jeronimo Greaves M.D.   On: 02/11/2015 14:34     EKG Interpretation None      MDM   Final diagnoses:  URI (upper respiratory infection)  Patient with history of chronic bronchitis and is a 1ppd smoker presents for sore throat and cough x 2 days.  He denies any fever, throat swelling, voice changes, or ear pain.  He is sitting comfortably in no distress, no tripoding, he is not drooling.  On exam he has no tonsillar swelling or exudate.  Ears normal. He does have bilateral wheezing.   After breathing treatment, he sounds much better. Strep negative.  CXR is negative for pneumonia, pleural effusion or pneumothorax.  He has no fever and vitals are stable. I think this is a viral URI.  I have prescribed steroids and cough medicine.  He agrees with the plan and to f/u with his pcp.     Catha Gosselin, PA-C 02/12/15 1128  Lorre Nick, MD 02/16/15 1539

## 2015-02-11 NOTE — ED Notes (Signed)
Pt reports URI x 4 days. Reports NP cough and sore throat. Reports hx of viral pneumonia, states this feels the same. Denies fever/chills. NAD.

## 2015-02-11 NOTE — Discharge Instructions (Signed)
Upper Respiratory Infection, Adult An upper respiratory infection (URI) is also sometimes known as the common cold. The upper respiratory tract includes the nose, sinuses, throat, trachea, and bronchi. Bronchi are the airways leading to the lungs. Most people improve within 1 week, but symptoms can last up to 2 weeks. A residual cough may last even longer.  CAUSES Many different viruses can infect the tissues lining the upper respiratory tract. The tissues become irritated and inflamed and often become very moist. Mucus production is also common. A cold is contagious. You can easily spread the virus to others by oral contact. This includes kissing, sharing a glass, coughing, or sneezing. Touching your mouth or nose and then touching a surface, which is then touched by another person, can also spread the virus. SYMPTOMS  Symptoms typically develop 1 to 3 days after you come in contact with a cold virus. Symptoms vary from person to person. They may include:  Runny nose.  Sneezing.  Nasal congestion.  Sinus irritation.  Sore throat.  Loss of voice (laryngitis).  Cough.  Fatigue.  Muscle aches.  Loss of appetite.  Headache.  Low-grade fever. DIAGNOSIS  You might diagnose your own cold based on familiar symptoms, since most people get a cold 2 to 3 times a year. Your caregiver can confirm this based on your exam. Most importantly, your caregiver can check that your symptoms are not due to another disease such as strep throat, sinusitis, pneumonia, asthma, or epiglottitis. Blood tests, throat tests, and X-rays are not necessary to diagnose a common cold, but they may sometimes be helpful in excluding other more serious diseases. Your caregiver will decide if any further tests are required. RISKS AND COMPLICATIONS  You may be at risk for a more severe case of the common cold if you smoke cigarettes, have chronic heart disease (such as heart failure) or lung disease (such as asthma), or if  you have a weakened immune system. The very young and very old are also at risk for more serious infections. Bacterial sinusitis, middle ear infections, and bacterial pneumonia can complicate the common cold. The common cold can worsen asthma and chronic obstructive pulmonary disease (COPD). Sometimes, these complications can require emergency medical care and may be life-threatening. PREVENTION  The best way to protect against getting a cold is to practice good hygiene. Avoid oral or hand contact with people with cold symptoms. Wash your hands often if contact occurs. There is no clear evidence that vitamin C, vitamin E, echinacea, or exercise reduces the chance of developing a cold. However, it is always recommended to get plenty of rest and practice good nutrition. TREATMENT  Treatment is directed at relieving symptoms. There is no cure. Antibiotics are not effective, because the infection is caused by a virus, not by bacteria. Treatment may include:  Increased fluid intake. Sports drinks offer valuable electrolytes, sugars, and fluids.  Breathing heated mist or steam (vaporizer or shower).  Eating chicken soup or other clear broths, and maintaining good nutrition.  Getting plenty of rest.  Using gargles or lozenges for comfort.  Controlling fevers with ibuprofen or acetaminophen as directed by your caregiver.  Increasing usage of your inhaler if you have asthma. Zinc gel and zinc lozenges, taken in the first 24 hours of the common cold, can shorten the duration and lessen the severity of symptoms. Pain medicines may help with fever, muscle aches, and throat pain. A variety of non-prescription medicines are available to treat congestion and runny nose. Your caregiver   can make recommendations and may suggest nasal or lung inhalers for other symptoms.  HOME CARE INSTRUCTIONS   Only take over-the-counter or prescription medicines for pain, discomfort, or fever as directed by your  caregiver.  Use a warm mist humidifier or inhale steam from a shower to increase air moisture. This may keep secretions moist and make it easier to breathe.  Drink enough water and fluids to keep your urine clear or pale yellow.  Rest as needed.  Return to work when your temperature has returned to normal or as your caregiver advises. You may need to stay home longer to avoid infecting others. You can also use a face mask and careful hand washing to prevent spread of the virus. SEEK MEDICAL CARE IF:   After the first few days, you feel you are getting worse rather than better.  You need your caregiver's advice about medicines to control symptoms.  You develop chills, worsening shortness of breath, or brown or red sputum. These may be signs of pneumonia.  You develop yellow or brown nasal discharge or pain in the face, especially when you bend forward. These may be signs of sinusitis.  You develop a fever, swollen neck glands, pain with swallowing, or white areas in the back of your throat. These may be signs of strep throat. SEEK IMMEDIATE MEDICAL CARE IF:   You have a fever.  You develop severe or persistent headache, ear pain, sinus pain, or chest pain.  You develop wheezing, a prolonged cough, cough up blood, or have a change in your usual mucus (if you have chronic lung disease).  You develop sore muscles or a stiff neck. Document Released: 04/22/2001 Document Revised: 01/19/2012 Document Reviewed: 02/01/2014 ExitCare Patient Information 2015 [employer]. This information is not intended to replace advice given to you by your health care provider. Make sure you discuss any questions you have with your health care provider.  

## 2015-02-13 LAB — CULTURE, GROUP A STREP: Strep A Culture: NEGATIVE

## 2015-05-09 ENCOUNTER — Encounter (HOSPITAL_COMMUNITY): Payer: Self-pay | Admitting: Family Medicine

## 2015-05-09 ENCOUNTER — Emergency Department (HOSPITAL_COMMUNITY): Payer: Non-veteran care

## 2015-05-09 ENCOUNTER — Emergency Department (HOSPITAL_COMMUNITY)
Admission: EM | Admit: 2015-05-09 | Discharge: 2015-05-09 | Disposition: A | Discharge status: Home / Self Care | Payer: Non-veteran care | Attending: Emergency Medicine | Admitting: Emergency Medicine

## 2015-05-09 DIAGNOSIS — Z79899 Other long term (current) drug therapy: Secondary | ICD-10-CM | POA: Insufficient documentation

## 2015-05-09 DIAGNOSIS — Z720 Tobacco use: Secondary | ICD-10-CM | POA: Insufficient documentation

## 2015-05-09 DIAGNOSIS — E669 Obesity, unspecified: Secondary | ICD-10-CM | POA: Insufficient documentation

## 2015-05-09 DIAGNOSIS — M25511 Pain in right shoulder: Secondary | ICD-10-CM | POA: Insufficient documentation

## 2015-05-09 NOTE — ED Provider Notes (Signed)
CSN: [id number]     Arrival date & time 05/09/15  1131 History  This chart was scribed for Celene Skeen, PA-C, working with Cathren Laine, MD by Elon Spanner, ED Scribe. This patient was seen in room TR05C/TR05C and the patient's care was started at 11:42 AM.   Chief Complaint  Patient presents with  . Shoulder Pain   The history is provided by the patient. No language interpreter was used.   HPI Comments: Dalton [name] is a 39 y.o. male who presents to the Emergency Department complaining of right shoulder pain onset 1.5 years ago.  The patient reports he injured his shoulder at work but is not sure of the exact mechanism.  Since this time, his complaint has improved, but he he reports cracking/popping sounds with certain movements.  The pain is worsened with certain motions.  He reports he has tried to be seen at the [place], but was told that they could not address his complaint because it was not related to his time in the service.  His primary motivator for coming to the ED today was that he had the day off work and he decided to stop putting off addressing this complaint.  There are not acute changes.     Past Medical History  Diagnosis Date  . Seizures     10 years ago  . Headache(784.0)   . SVT (supraventricular tachycardia)    Past Surgical History  Procedure Laterality Date  . Right knee      plate with screws  . Ankle surgery      left with screws   Family History  Problem Relation Age of Onset  . Diabetes Father    History  Substance Use Topics  . Smoking status: Current Every Day Smoker -- 1.50 packs/day for 16 years  . Smokeless tobacco: Not on file  . Alcohol Use: Yes     Comment: ocassioinally    Review of Systems  Constitutional: Negative for fever.  Respiratory: Negative for shortness of breath.   Cardiovascular: Negative for chest pain.  Musculoskeletal: Positive for arthralgias.      Allergies  Review of patient's allergies indicates no known  allergies.  Home Medications   Prior to Admission medications   Medication Sig Start Date End Date Taking? Authorizing Provider  acetaminophen (TYLENOL) 500 MG tablet Take 1,000 mg by mouth every 6 (six) hours as needed for fever.    Historical Provider, MD  benzonatate (TESSALON) 100 MG capsule Take 1 capsule (100 mg total) by mouth every 8 (eight) hours. 02/11/15   Hanna Patel-Mills, PA-C  dexamethasone (DECADRON) 6 MG tablet 1 po bid with food Patient not taking: Reported on 12/28/2014 06/17/14   Ivery Quale, PA-C  diphenhydrAMINE (BENADRYL) 25 MG tablet Take 25 mg by mouth every 6 (six) hours as needed for itching or allergies.    Historical Provider, MD  famotidine (PEPCID) 20 MG tablet Take 1 tablet (20 mg total) by mouth 2 (two) times daily. 12/28/14   Eber Hong, MD  HYDROcodone-acetaminophen (NORCO/VICODIN) 5-325 MG per tablet Take 1 tablet by mouth every 6 (six) hours as needed for moderate pain.    Historical Provider, MD  naproxen (NAPROSYN) 500 MG tablet Take 500 mg by mouth 2 (two) times daily as needed for mild pain or moderate pain.    Historical Provider, MD  predniSONE (STERAPRED UNI-PAK) 10 MG tablet Take by mouth taper from 4 doses each day to 1 dose and stop. 02/11/15   Catha Gosselin,  PA-C  sucralfate (CARAFATE) 1 G tablet Take 1 tablet (1 g total) by mouth 4 (four) times daily -  with meals and at bedtime. 12/28/14   Eber HongBrian Miller, MD   BP 128/73 mmHg  Pulse 104  Temp(Src) 98.5 F (36.9 C) (Oral)  Resp 17  SpO2 98% Physical Exam  Constitutional: He is oriented to person, place, and time. He appears well-developed and well-nourished. No distress.  Obese.  HENT:  Head: Normocephalic and atraumatic.  Eyes: Conjunctivae and EOM are normal.  Neck: Normal range of motion. Neck supple.  Cardiovascular: Normal rate, regular rhythm and normal heart sounds.   Pulses:      Radial pulses are 2+ on the right side.  Pulmonary/Chest: Effort normal and breath sounds normal.   Musculoskeletal: Normal range of motion. He exhibits no edema.  R shoulder TTP over entire shoulder girdle. No swelling or deformity. Pain increased with range of motion, increased pain present with flexion and abduction and 90. Negative empty can test.  Neurological: He is alert and oriented to person, place, and time.  Skin: Skin is warm and dry.  Psychiatric: He has a normal mood and affect. His behavior is normal.  Nursing note and vitals reviewed.   ED Course  Procedures (including critical care time)  DIAGNOSTIC STUDIES: Oxygen Saturation is 98% on RA, normal by my interpretation.    COORDINATION OF CARE:  11:46 AM Discussed treatment plan with patient at bedside.  Patient acknowledges and agrees with plan.    Labs Review Labs Reviewed - No data to display  Imaging Review Dg Shoulder Right  05/09/2015   CLINICAL DATA:  Pain at acromioclavicular joint with twisting injury a year ago.  EXAM: RIGHT SHOULDER - 2+ VIEW  COMPARISON:  04/11/2013  FINDINGS: Visualized portion of the right hemithorax is normal. No acute fracture or dislocation. Joint spaces maintained.  IMPRESSION: No acute osseous abnormality.   Electronically Signed   By: Jeronimo GreavesKyle  Talbot M.D.   On: 05/09/2015 12:09     EKG Interpretation None      MDM   Final diagnoses:  Right shoulder pain   Neurovascularly intact. X-rays negative. Symptoms ongoing for a year and a half after work injury. Possible rotator cuff tendinitis or injury. Advised ice and Tylenol. Patient states he cannot take NSAIDs. Follow-up with orthopedics. He may need an MRI. Stable for discharge. Return precautions given. Patient states understanding of treatment care plan and is agreeable.  I personally performed the services described in this documentation, which was scribed in my presence. The recorded information has been reviewed and is accurate.  Kathrynn SpeedRobyn M Keianna Signer, PA-C 05/09/15 1226  Cathren LaineKevin Steinl, MD 05/09/15 [phone]

## 2015-05-09 NOTE — ED Notes (Addendum)
Patient refusing to sign for discharge states he is wanting MRI and does not "want to to sign away my rights'.  RN explained follow-up information with orthopedist for non-emergent work up and MRI. Pt verbalized understanding. Pt kindly refused ice pack, heat pack and wheelchair and placed on his back pack and RN walked patient out.  Patient with steady gait

## 2015-05-09 NOTE — Discharge Instructions (Signed)
Apply ice to her shoulder intermittently. You may take Tylenol as needed for pain. Follow-up with orthopedics.  Shoulder Pain The shoulder is the joint that connects your arms to your body. The bones that form the shoulder joint include the upper arm bone (humerus), the shoulder blade (scapula), and the collarbone (clavicle). The top of the humerus is shaped like a ball and fits into a rather flat socket on the scapula (glenoid cavity). A combination of muscles and strong, fibrous tissues that connect muscles to bones (tendons) support your shoulder joint and hold the ball in the socket. Small, fluid-filled sacs (bursae) are located in different areas of the joint. They act as cushions between the bones and the overlying soft tissues and help reduce friction between the gliding tendons and the bone as you move your arm. Your shoulder joint allows a wide range of motion in your arm. This range of motion allows you to do things like scratch your back or throw a ball. However, this range of motion also makes your shoulder more prone to pain from overuse and injury. Causes of shoulder pain can originate from both injury and overuse and usually can be grouped in the following four categories:  Redness, swelling, and pain (inflammation) of the tendon (tendinitis) or the bursae (bursitis).  Instability, such as a dislocation of the joint.  Inflammation of the joint (arthritis).  Broken bone (fracture). HOME CARE INSTRUCTIONS   Apply ice to the sore area.  Put ice in a plastic bag.  Place a towel between your skin and the bag.  Leave the ice on for 15-20 minutes, 3-4 times per day for the first 2 days, or as directed by your health care provider.  Stop using cold packs if they do not help with the pain.  If you have a shoulder sling or immobilizer, wear it as long as your caregiver instructs. Only remove it to shower or bathe. Move your arm as little as possible, but keep your hand moving to prevent  swelling.  Squeeze a soft ball or foam pad as much as possible to help prevent swelling.  Only take over-the-counter or prescription medicines for pain, discomfort, or fever as directed by your caregiver. SEEK MEDICAL CARE IF:   Your shoulder pain increases, or new pain develops in your arm, hand, or fingers.  Your hand or fingers become cold and numb.  Your pain is not relieved with medicines. SEEK IMMEDIATE MEDICAL CARE IF:   Your arm, hand, or fingers are numb or tingling.  Your arm, hand, or fingers are significantly swollen or turn white or blue. MAKE SURE YOU:   Understand these instructions.  Will watch your condition.  Will get help right away if you are not doing well or get worse. Document Released: 08/06/2005 Document Revised: 03/13/2014 Document Reviewed: 10/11/2011 [facility] Patient Information 2015 [place] [place]. This information is not intended to replace advice given to you by your health care provider. Make sure you discuss any questions you have with your health care provider.

## 2015-05-09 NOTE — ED Notes (Signed)
Pt here for right shoulder pain sts x year and 1/2. sts hurts with movement.

## 2015-07-31 ENCOUNTER — Emergency Department (HOSPITAL_COMMUNITY): Payer: No Typology Code available for payment source

## 2015-07-31 ENCOUNTER — Encounter (HOSPITAL_COMMUNITY): Payer: Self-pay

## 2015-07-31 ENCOUNTER — Emergency Department (HOSPITAL_COMMUNITY)
Admission: EM | Admit: 2015-07-31 | Discharge: 2015-08-01 | Disposition: A | Discharge status: Home / Self Care | Payer: No Typology Code available for payment source | Attending: Emergency Medicine | Admitting: Emergency Medicine

## 2015-07-31 DIAGNOSIS — S299XXA Unspecified injury of thorax, initial encounter: Secondary | ICD-10-CM | POA: Diagnosis not present

## 2015-07-31 DIAGNOSIS — S3992XA Unspecified injury of lower back, initial encounter: Secondary | ICD-10-CM | POA: Diagnosis not present

## 2015-07-31 DIAGNOSIS — Y998 Other external cause status: Secondary | ICD-10-CM | POA: Insufficient documentation

## 2015-07-31 DIAGNOSIS — S79912A Unspecified injury of left hip, initial encounter: Secondary | ICD-10-CM | POA: Diagnosis not present

## 2015-07-31 DIAGNOSIS — S43401A Unspecified sprain of right shoulder joint, initial encounter: Secondary | ICD-10-CM | POA: Diagnosis not present

## 2015-07-31 DIAGNOSIS — Z79899 Other long term (current) drug therapy: Secondary | ICD-10-CM | POA: Diagnosis not present

## 2015-07-31 DIAGNOSIS — Y92410 Unspecified street and highway as the place of occurrence of the external cause: Secondary | ICD-10-CM | POA: Diagnosis not present

## 2015-07-31 DIAGNOSIS — Z8669 Personal history of other diseases of the nervous system and sense organs: Secondary | ICD-10-CM | POA: Diagnosis not present

## 2015-07-31 DIAGNOSIS — S134XXA Sprain of ligaments of cervical spine, initial encounter: Secondary | ICD-10-CM | POA: Insufficient documentation

## 2015-07-31 DIAGNOSIS — S199XXA Unspecified injury of neck, initial encounter: Secondary | ICD-10-CM | POA: Diagnosis present

## 2015-07-31 DIAGNOSIS — S0990XA Unspecified injury of head, initial encounter: Secondary | ICD-10-CM | POA: Insufficient documentation

## 2015-07-31 DIAGNOSIS — Z720 Tobacco use: Secondary | ICD-10-CM | POA: Diagnosis not present

## 2015-07-31 DIAGNOSIS — Y9389 Activity, other specified: Secondary | ICD-10-CM | POA: Diagnosis not present

## 2015-07-31 DIAGNOSIS — S93402A Sprain of unspecified ligament of left ankle, initial encounter: Secondary | ICD-10-CM | POA: Insufficient documentation

## 2015-07-31 DIAGNOSIS — S139XXA Sprain of joints and ligaments of unspecified parts of neck, initial encounter: Secondary | ICD-10-CM

## 2015-07-31 DIAGNOSIS — Z8679 Personal history of other diseases of the circulatory system: Secondary | ICD-10-CM | POA: Diagnosis not present

## 2015-07-31 MED ORDER — OXYCODONE-ACETAMINOPHEN 5-325 MG PO TABS
2.0000 | ORAL_TABLET | Freq: Once | ORAL | Status: AC
  Administered 2015-07-31: 2 via ORAL
  Filled 2015-07-31: qty 2
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
## 2015-08-01 ENCOUNTER — Emergency Department (HOSPITAL_COMMUNITY): Payer: No Typology Code available for payment source

## 2015-08-01 NOTE — ED Notes (Signed)
Patient able to dress independently and ambulate with gait steady and even.

## 2015-08-01 NOTE — Discharge Instructions (Signed)
Ankle Sprain °An ankle sprain is an injury to the strong, fibrous tissues (ligaments) that hold the bones of your ankle joint together.  °CAUSES °An ankle sprain is usually caused by a fall or by twisting your ankle. Ankle sprains most commonly occur when you step on the outer edge of your foot, and your ankle turns inward. People who participate in sports are more prone to these types of injuries.  °SYMPTOMS  °· Pain in your ankle. The pain may be present at rest or only when you are trying to stand or walk. °· Swelling. °· Bruising. Bruising may develop immediately or within 1 to 2 days after your injury. °· Difficulty standing or walking, particularly when turning corners or changing directions. °DIAGNOSIS  °Your caregiver will ask you details about your injury and perform a physical exam of your ankle to determine if you have an ankle sprain. During the physical exam, your caregiver will press on and apply pressure to specific areas of your foot and ankle. Your caregiver will try to move your ankle in certain ways. An X-ray exam may be done to be sure a bone was not broken or a ligament did not separate from one of the bones in your ankle (avulsion fracture).  °TREATMENT  °Certain types of braces can help stabilize your ankle. Your caregiver can make a recommendation for this. Your caregiver may recommend the use of medicine for pain. If your sprain is severe, your caregiver may refer you to a surgeon who helps to restore function to parts of your skeletal system (orthopedist) or a physical therapist. °HOME CARE INSTRUCTIONS  °· Apply ice to your injury for 1-2 days or as directed by your caregiver. Applying ice helps to reduce inflammation and pain. °¨ Put ice in a plastic bag. °¨ Place a towel between your skin and the bag. °¨ Leave the ice on for 15-20 minutes at a time, every 2 hours while you are awake. °· Only take over-the-counter or prescription medicines for pain, discomfort, or fever as directed by  your caregiver. °· Elevate your injured ankle above the level of your heart as much as possible for 2-3 days. °· If your caregiver recommends crutches, use them as instructed. Gradually put weight on the affected ankle. Continue to use crutches or a cane until you can walk without feeling pain in your ankle. °· If you have a plaster splint, wear the splint as directed by your caregiver. Do not rest it on anything harder than a pillow for the first 24 hours. Do not put weight on it. Do not get it wet. You may take it off to take a shower or bath. °· You may have been given an elastic bandage to wear around your ankle to provide support. If the elastic bandage is too tight (you have numbness or tingling in your foot or your foot becomes cold and blue), adjust the bandage to make it comfortable. °· If you have an air splint, you may blow more air into it or let air out to make it more comfortable. You may take your splint off at night and before taking a shower or bath. Wiggle your toes in the splint several times per day to decrease swelling. °SEEK MEDICAL CARE IF:  °· You have rapidly increasing bruising or swelling. °· Your toes feel extremely cold or you lose feeling in your foot. °· Your pain is not relieved with medicine. °SEEK IMMEDIATE MEDICAL CARE IF: °· Your toes are numb or blue. °·   You have severe pain that is increasing. MAKE SURE YOU:   Understand these instructions.  Will watch your condition.  Will get help right away if you are not doing well or get worse. Document Released: 10/27/2005 Document Revised: 07/21/2012 Document Reviewed: 11/08/2011 [facility] Patient Information 2015 [place], [place]. This information is not intended to replace advice given to you by your health care provider. Make sure you discuss any questions you have with your health care provider.   You have neck pain, possibly from a cervical strain and/or pinched nerve.   SEEK IMMEDIATE MEDICAL ATTENTION IF: You develop  difficulties swallowing or breathing.  You have new or worse numbness, weakness, tingling, or movement problems in your arms or legs.  You develop increasing pain which is uncontrolled with medications.  You have change in bowel or bladder function, or other concerns.

## 2015-08-21 ENCOUNTER — Ambulatory Visit (HOSPITAL_COMMUNITY)
Admission: RE | Admit: 2015-08-21 | Discharge: 2015-08-21 | Disposition: A | Discharge status: Home / Self Care | Payer: No Typology Code available for payment source | Source: Ambulatory Visit | Attending: Chiropractic Medicine | Admitting: Chiropractic Medicine

## 2015-08-21 ENCOUNTER — Other Ambulatory Visit (HOSPITAL_COMMUNITY): Payer: Self-pay | Admitting: Chiropractic Medicine

## 2015-08-21 DIAGNOSIS — M545 Low back pain: Secondary | ICD-10-CM | POA: Insufficient documentation

## 2015-08-21 DIAGNOSIS — M5134 Other intervertebral disc degeneration, thoracic region: Secondary | ICD-10-CM | POA: Diagnosis not present

## 2015-08-21 DIAGNOSIS — R0989 Other specified symptoms and signs involving the circulatory and respiratory systems: Secondary | ICD-10-CM | POA: Insufficient documentation

## 2015-11-11 ENCOUNTER — Encounter (HOSPITAL_COMMUNITY): Payer: Self-pay | Admitting: Emergency Medicine

## 2015-11-11 DIAGNOSIS — S29011A Strain of muscle and tendon of front wall of thorax, initial encounter: Secondary | ICD-10-CM | POA: Diagnosis not present

## 2015-11-11 DIAGNOSIS — Z8679 Personal history of other diseases of the circulatory system: Secondary | ICD-10-CM | POA: Diagnosis not present

## 2015-11-11 DIAGNOSIS — S199XXA Unspecified injury of neck, initial encounter: Secondary | ICD-10-CM | POA: Insufficient documentation

## 2015-11-11 DIAGNOSIS — S3992XA Unspecified injury of lower back, initial encounter: Secondary | ICD-10-CM | POA: Insufficient documentation

## 2015-11-11 DIAGNOSIS — Y998 Other external cause status: Secondary | ICD-10-CM | POA: Insufficient documentation

## 2015-11-11 DIAGNOSIS — S4991XA Unspecified injury of right shoulder and upper arm, initial encounter: Secondary | ICD-10-CM | POA: Insufficient documentation

## 2015-11-11 DIAGNOSIS — Y9389 Activity, other specified: Secondary | ICD-10-CM | POA: Diagnosis not present

## 2015-11-11 DIAGNOSIS — S20212A Contusion of left front wall of thorax, initial encounter: Secondary | ICD-10-CM | POA: Diagnosis not present

## 2015-11-11 DIAGNOSIS — F17200 Nicotine dependence, unspecified, uncomplicated: Secondary | ICD-10-CM | POA: Diagnosis not present

## 2015-11-11 DIAGNOSIS — Y9289 Other specified places as the place of occurrence of the external cause: Secondary | ICD-10-CM | POA: Diagnosis not present

## 2015-11-11 DIAGNOSIS — S29001A Unspecified injury of muscle and tendon of front wall of thorax, initial encounter: Secondary | ICD-10-CM | POA: Diagnosis present
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
## 2015-11-12 ENCOUNTER — Emergency Department (HOSPITAL_COMMUNITY)
Admission: EM | Admit: 2015-11-12 | Discharge: 2015-11-12 | Disposition: A | Discharge status: Home / Self Care | Payer: Non-veteran care | Attending: Emergency Medicine | Admitting: Emergency Medicine

## 2015-11-12 ENCOUNTER — Emergency Department (HOSPITAL_COMMUNITY): Payer: Non-veteran care

## 2015-11-12 DIAGNOSIS — S20212A Contusion of left front wall of thorax, initial encounter: Secondary | ICD-10-CM

## 2015-11-12 DIAGNOSIS — T148XXA Other injury of unspecified body region, initial encounter: Secondary | ICD-10-CM

## 2015-11-12 MED ORDER — METHOCARBAMOL 500 MG PO TABS
1000.0000 mg | ORAL_TABLET | Freq: Once | ORAL | Status: AC
  Administered 2015-11-12: 1000 mg via ORAL
  Filled 2015-11-12: qty 2

## 2015-11-12 MED ORDER — KETOROLAC TROMETHAMINE 60 MG/2ML IM SOLN
60.0000 mg | Freq: Once | INTRAMUSCULAR | Status: AC
  Administered 2015-11-12: 60 mg via INTRAMUSCULAR
  Filled 2015-11-12: qty 2

## 2015-11-12 MED ORDER — IBUPROFEN 600 MG PO TABS: 600.0000 mg | ORAL_TABLET | Freq: Four times a day (QID) | ORAL | Status: AC | PRN

## 2015-11-12 MED ORDER — METHOCARBAMOL 500 MG PO TABS: 1000.0000 mg | ORAL_TABLET | Freq: Three times a day (TID) | ORAL | Status: AC | PRN

## 2015-11-12 MED ORDER — TRAMADOL HCL 50 MG PO TABS: 50.0000 mg | ORAL_TABLET | Freq: Four times a day (QID) | ORAL | Status: AC | PRN
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
## 2015-11-21 ENCOUNTER — Encounter (HOSPITAL_COMMUNITY): Payer: Self-pay

## 2015-11-21 DIAGNOSIS — R0781 Pleurodynia: Secondary | ICD-10-CM | POA: Insufficient documentation

## 2015-11-21 DIAGNOSIS — F17200 Nicotine dependence, unspecified, uncomplicated: Secondary | ICD-10-CM | POA: Diagnosis not present
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
## 2015-11-22 ENCOUNTER — Emergency Department (HOSPITAL_COMMUNITY)
Admission: EM | Admit: 2015-11-22 | Discharge: 2015-11-22 | Discharge status: Against Medical Advice | Payer: Non-veteran care | Attending: Emergency Medicine | Admitting: Emergency Medicine

## 2015-11-22 NOTE — ED Notes (Signed)
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

## 2016-06-19 ENCOUNTER — Emergency Department (HOSPITAL_COMMUNITY)
Admission: EM | Admit: 2016-06-19 | Discharge: 2016-06-19 | Disposition: A | Discharge status: Home / Self Care | Payer: Self-pay | Attending: Physician Assistant | Admitting: Physician Assistant

## 2016-06-19 ENCOUNTER — Encounter (HOSPITAL_COMMUNITY): Payer: Self-pay

## 2016-06-19 ENCOUNTER — Emergency Department (HOSPITAL_COMMUNITY): Payer: Self-pay

## 2016-06-19 DIAGNOSIS — F17200 Nicotine dependence, unspecified, uncomplicated: Secondary | ICD-10-CM | POA: Insufficient documentation

## 2016-06-19 DIAGNOSIS — J029 Acute pharyngitis, unspecified: Secondary | ICD-10-CM | POA: Insufficient documentation

## 2016-06-19 LAB — I-STAT CHEM 8, ED
BUN: 12 mg/dL (ref 6–20)
CHLORIDE: 100 mmol/L — AB (ref 101–111)
CREATININE: 0.9 mg/dL (ref 0.61–1.24)
Calcium, Ion: 1.15 mmol/L (ref 1.13–1.30)
GLUCOSE: 118 mg/dL — AB (ref 65–99)
HCT: 49 % (ref 39.0–52.0)
HEMOGLOBIN: 16.7 g/dL (ref 13.0–17.0)
Potassium: 4.1 mmol/L (ref 3.5–5.1)
Sodium: 138 mmol/L (ref 135–145)
TCO2: 27 mmol/L (ref 0–100)

## 2016-06-19 LAB — RAPID STREP SCREEN (MED CTR MEBANE ONLY): STREPTOCOCCUS, GROUP A SCREEN (DIRECT): NEGATIVE

## 2016-06-19 MED ORDER — LIDOCAINE VISCOUS 2 % MT SOLN: 15.0000 mL | OROMUCOSAL | 0 refills | Status: AC | PRN

## 2016-06-19 MED ORDER — IOPAMIDOL (ISOVUE-300) INJECTION 61%
INTRAVENOUS | Status: AC
  Administered 2016-06-19: 75 mL
  Filled 2016-06-19: qty 75

## 2016-06-19 MED ORDER — DEXAMETHASONE SODIUM PHOSPHATE 10 MG/ML IJ SOLN
10.0000 mg | Freq: Once | INTRAMUSCULAR | Status: AC
  Administered 2016-06-19: 10 mg via INTRAMUSCULAR
  Filled 2016-06-19: qty 1

## 2016-06-19 NOTE — ED Triage Notes (Signed)
Patient here with difficulty swallowing, fever and chills since last pm. Throat red and tonsils enlarged on arrival, no trouble handling secretions.

## 2016-06-19 NOTE — ED Notes (Signed)
Pt back from CT

## 2016-06-19 NOTE — ED Provider Notes (Signed)
[facility]-EMERGENCY DEPT Provider Note   CSN: [id number] Arrival date & time: 06/19/16  1522  First Provider Contact:   First MD Initiated Contact with Patient 06/19/16 [phone]     By signing my name below, I, Christy SartoriusAnastasia Kolousek, attest that this documentation has been prepared under the direction and in the presence of  Felicie Mornavid Mckaylah Bettendorf, NP. Electronically Signed: Christy SartoriusAnastasia Kolousek, ED Scribe. 06/19/16. 4:38 PM.  History   Chief Complaint Chief Complaint  Patient presents with  . Dysphagia  . Fever    HPI Comments:  Carol Adalan M Tinnel is a 40 y.o. male who presents to the Emergency Department complaining of moderate, sudden onset sore throat onset yesterday with associated sensation of throat swelling, mild difficulty swallowing. Pt also complains of associated neck pain, occipital headache, and subjective fever. Pt states sore throat is worsened by swallowing. He is able to tolerate fluids. Pt denies difficulty tolerating secretions, congestion, nausea, vomiting, abdominal pain, cough and rhinorrhea.   The history is provided by the patient. No language interpreter was used.  Fever  Associated symptoms include headaches and sore throat. Pertinent negatives include no diarrhea, no vomiting, no congestion and no cough.  Sore Throat  This is a new problem. The current episode started yesterday. The problem occurs constantly. The problem has not changed since onset.Associated symptoms include headaches. Pertinent negatives include no abdominal pain. The symptoms are aggravated by swallowing. Nothing relieves the symptoms. He has tried nothing for the symptoms. The treatment provided no relief.    Past Medical History:  Diagnosis Date  . Headache(784.0)   . Seizures (HCC)    10 years ago  . SVT (supraventricular tachycardia) [facility](HCC)     Patient Active Problem List   Diagnosis Date Noted  . Diarrhea 08/17/2013  . Tobacco use disorder 08/17/2013  . Fever 08/15/2013  . Sepsis (HCC) 08/15/2013  .  CAP (community acquired pneumonia) 08/15/2013    Past Surgical History:  Procedure Laterality Date  . ANKLE SURGERY     left with screws  . right knee     plate with screws       Home Medications    Prior to Admission medications   Medication Sig Start Date End Date Taking? Authorizing Provider  acetaminophen (TYLENOL) 500 MG tablet Take 1,000 mg by mouth every 6 (six) hours as needed for mild pain or fever.     Historical Provider, MD  ibuprofen (ADVIL,MOTRIN) 600 MG tablet Take 1 tablet (600 mg total) by mouth every 6 (six) hours as needed for moderate pain. 11/12/15   Loren Raceravid Yelverton, MD  methocarbamol (ROBAXIN) 500 MG tablet Take 2 tablets (1,000 mg total) by mouth every 8 (eight) hours as needed for muscle spasms. 11/12/15   Loren Raceravid Yelverton, MD  traMADol (ULTRAM) 50 MG tablet Take 1 tablet (50 mg total) by mouth every 6 (six) hours as needed for severe pain. 11/12/15   Loren Raceravid Yelverton, MD    Family History Family History  Problem Relation Age of Onset  . Diabetes Father     Social History Social History  Substance Use Topics  . Smoking status: Current Every Day Smoker    Packs/day: 1.50    Years: 16.00  . Smokeless tobacco: Never Used  . Alcohol use Yes     Comment: ocassioinally     Allergies   Review of patient's allergies indicates no known allergies.   Review of Systems Review of Systems  Constitutional: Positive for fever (subjective).  HENT: Positive for sore throat and trouble  swallowing. Negative for congestion and rhinorrhea.   Respiratory: Negative for cough.   Gastrointestinal: Negative for abdominal pain, diarrhea, nausea and vomiting.  Musculoskeletal: Positive for neck pain.  Neurological: Positive for headaches.  All other systems reviewed and are negative.    Physical Exam Updated Vital Signs BP (!) 132/108 (BP Location: Left Wrist)   Pulse 115   Temp 98.9 F (37.2 C) (Oral)   Resp 14   Ht  (1.803 m)   Wt 280 lb (127 kg)   SpO2  99%   BMI 39.05 kg/m   Physical Exam  Constitutional: He appears well-developed and well-nourished.  HENT:  Head: Normocephalic.  Posterior oropharynx was erythematous with some mild swelling.  Mild leftward shift of the uvula.  Eyes: Conjunctivae are normal.  Cardiovascular: Normal rate, regular rhythm and normal heart sounds.   Pulmonary/Chest: Effort normal and breath sounds normal. No respiratory distress. He has no wheezes. He has no rales.  Abdominal: Soft. Bowel sounds are normal. He exhibits no distension.  Musculoskeletal: Normal range of motion.  Neurological: He is alert.  Skin: Skin is warm and dry.  Psychiatric: He has a normal mood and affect. His behavior is normal.  Nursing note and vitals reviewed.    ED Treatments / Results     Labs (all labs ordered are listed, but only abnormal results are displayed) Labs Reviewed  RAPID STREP SCREEN (NOT AT [facility])  CULTURE, GROUP A STREP [facility])    EKG  EKG Interpretation None       Radiology Ct Soft Tissue Neck W Contrast  Result Date: 06/19/2016 CLINICAL DATA:  Dysphagia.  Tonsillar swelling since last night. EXAM: CT NECK WITH CONTRAST TECHNIQUE: Multidetector CT imaging of the neck was performed using the standard protocol following the bolus administration of intravenous contrast. CONTRAST:  75mL ISOVUE-300 IOPAMIDOL (ISOVUE-300) INJECTION 61% COMPARISON:  Cervical spine CT 08/01/2015. FINDINGS: Pharynx and larynx: There is moderate right and mild left palatine tonsillar enlargement. There is asymmetric effacement of the right vallecula. There is mild symmetric prominence of the posterior nasopharyngeal soft tissues. There is soft tissue thickening involving the soft palate and uvula, and there is moderate narrowing of the upper oropharyngeal airway. No peritonsillar or retropharyngeal fluid collection is seen. Larynx is unremarkable. Salivary glands: The submandibular and parotid glands are unremarkable. Thyroid:  Unremarkable. Lymph nodes: Mild enlargement of level II lymph nodes measuring up to 10 mm in short axis on the right and 12 mm on the left, likely reactive. Vascular: Major vascular structures of the neck appear patent. There is early, mild atherosclerotic plaque at both carotid bifurcations. Limited intracranial: Unremarkable. Visualized orbits: Unremarkable. Mastoids and visualized paranasal sinuses: Right maxillary sinus mucous retention cyst. Clear mastoid air cells. Skeleton: No acute abnormality. Upper chest: Clear lung apices. New superior mediastinal mass or lymph node enlargement. IMPRESSION: Tonsillar enlargement greater on the right which may reflect acute pharyngitis. No peritonsillar abscess. Electronically Signed   By: Sebastian Ache M.D.   On: 06/19/2016 18:18    Procedures Procedures (including critical care time)  DIAGNOSTIC STUDIES:  Oxygen Saturation is 99% on RA, nml by my interpretation.    COORDINATION OF CARE:  4:18 PM Will order CT neck, rapid strep.  Discussed treatment plan with pt at bedside and pt agreed to plan.  Medications Ordered in ED Medications - No data to display   Initial Impression / Assessment and Plan / ED Course  I have reviewed the triage vital signs and the nursing notes.  Pertinent labs & imaging results that were available during my care of the patient were reviewed by me and considered in my medical decision making (see chart for details).  Clinical Course   Pt with negative strep. Diagnosis of viral pharyngitis. No abx indicated at this time. Discussed that results of strep culture are pending and patient will be informed if positive result and abx will be called in at that time. Discharge with symptomatic tx. No evidence of dehydration. Pt is tolerating secretions. Presentation not concerning for peritonsillar abscess or spread of infection to deep spaces of the throat; patent airway. Specific return precautions discussed. Recommended PCP/ENT  follow up. Pt appears safe for discharge.   Final Clinical Impressions(s) / ED Diagnoses   Final diagnoses:  None  Pharyngitis.  New Prescriptions New Prescriptions   No medications on file   I personally performed the services described in this documentation, which was scribed in my presence. The recorded information has been reviewed and is accurate.    [name], NP 06/20/16 0038    Courteney Randall An, MD 06/24/16 1610

## 2016-06-19 NOTE — ED Notes (Signed)
Pt transported to CT ?

## 2016-06-21 LAB — CULTURE, GROUP A STREP (THRC)

## 2016-07-15 ENCOUNTER — Emergency Department (HOSPITAL_COMMUNITY): Payer: Non-veteran care

## 2016-07-15 ENCOUNTER — Emergency Department (HOSPITAL_COMMUNITY)
Admission: EM | Admit: 2016-07-15 | Discharge: 2016-07-15 | Disposition: A | Discharge status: Home / Self Care | Payer: Non-veteran care | Attending: Emergency Medicine | Admitting: Emergency Medicine

## 2016-07-15 ENCOUNTER — Encounter (HOSPITAL_COMMUNITY): Payer: Self-pay | Admitting: Emergency Medicine

## 2016-07-15 DIAGNOSIS — Z791 Long term (current) use of non-steroidal anti-inflammatories (NSAID): Secondary | ICD-10-CM | POA: Diagnosis not present

## 2016-07-15 DIAGNOSIS — Y92513 Shop (commercial) as the place of occurrence of the external cause: Secondary | ICD-10-CM | POA: Diagnosis not present

## 2016-07-15 DIAGNOSIS — Y9301 Activity, walking, marching and hiking: Secondary | ICD-10-CM | POA: Diagnosis not present

## 2016-07-15 DIAGNOSIS — Y990 Civilian activity done for income or pay: Secondary | ICD-10-CM | POA: Insufficient documentation

## 2016-07-15 DIAGNOSIS — F17200 Nicotine dependence, unspecified, uncomplicated: Secondary | ICD-10-CM | POA: Insufficient documentation

## 2016-07-15 DIAGNOSIS — Z79899 Other long term (current) drug therapy: Secondary | ICD-10-CM | POA: Insufficient documentation

## 2016-07-15 DIAGNOSIS — W010XXA Fall on same level from slipping, tripping and stumbling without subsequent striking against object, initial encounter: Secondary | ICD-10-CM | POA: Diagnosis not present

## 2016-07-15 DIAGNOSIS — S99921A Unspecified injury of right foot, initial encounter: Secondary | ICD-10-CM | POA: Diagnosis present

## 2016-07-15 DIAGNOSIS — S93601A Unspecified sprain of right foot, initial encounter: Secondary | ICD-10-CM | POA: Diagnosis not present

## 2016-07-15 NOTE — Discharge Instructions (Signed)
Ice and elevate at home. Ibuprofen or tylenol for pain. Avoid strenuous activity next several days. Wear post op shoe for 1-2 weeks. Follow up with orthopedics if not improving.

## 2016-07-15 NOTE — ED Triage Notes (Signed)
Patient reports tripping over a wooden board yesterday and has right foot pain ever since. Patient ambulatory to triage room.

## 2016-07-15 NOTE — ED Provider Notes (Signed)
Dalton [name], Dalton [name], Dalton [name], Dalton that this documentation has been prepared under the direction and in the presence of Dalton [name], Dalton [name], Dalton [name]   Chief Complaint Chief Complaint  Patient presents with  . Foot Pain    HPI Comments: [name] is a 40 y.o. male who presents to the Emergency Department complaining of gradually worsening moderate right foot pain s/p stepping on a wooden board yesterday. He explains that he was wearing sandals when he was walking in a yard and accidentally stepped on the board. He denies falling after stepping. No alleviating factors noted. He states that he has tried Motrin and ice with no relief. 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 No fever, chills, generalized rash, or any open wounds.   The [name] is provided by the patient. No language interpreter was used.    Past Medical [name]:  Diagnosis Date  . Headache(784.0)   . Seizures (HCC)    10 years ago  . SVT (supraventricular tachycardia) [facility])     Patient Active Problem List   Diagnosis Date Noted  . Diarrhea 08/17/2013  . Tobacco use disorder 08/17/2013  . Fever 08/15/2013  . Sepsis (HCC) 08/15/2013  . CAP (community acquired pneumonia) 08/15/2013    Past Surgical [name]:  Procedure Laterality Date  . ANKLE SURGERY     left with screws  . right knee     plate with screws       Home Medications    Prior to Admission medications   Medication Sig Start Date End Date Taking? Authorizing Provider  acetaminophen (TYLENOL) 500 MG tablet Take 1,000 mg by mouth every 6 (six) hours as needed for mild pain or fever.     Historical Provider, MD  ibuprofen (ADVIL,MOTRIN) 600 MG tablet Take 1 tablet (600 mg total) by mouth every 6 (six)  hours as needed for moderate pain. 11/12/15   Loren Racer, MD  lidocaine (XYLOCAINE) 2 % solution Use as directed 15 mLs in the mouth or throat as needed for mouth pain (Gargle and swallow.). 06/19/16   [name], NP  methocarbamol (ROBAXIN) 500 MG tablet Take 2 tablets (1,000 mg total) by mouth every 8 (eight) hours as needed for muscle spasms. 11/12/15   Loren Racer, MD  traMADol (ULTRAM) 50 MG tablet Take 1 tablet (50 mg total) by mouth every 6 (six) hours as needed for severe pain. 11/12/15   Loren Racer, MD    Family [name] Family [name]  Problem Relation Age of Onset  . Diabetes Father     Social [name] Social [name]  Substance Use Topics  . Smoking status: Current Every Day Smoker    Packs/day: 1.50    Years: 16.00  . Smokeless tobacco: Never Used  . Alcohol use Yes     Comment: ocassioinally     Allergies   Review of patient's allergies indicates no known allergies.   Review of Systems Review of Systems  Constitutional: Negative for chills and fever.  Musculoskeletal: Positive for arthralgias.  Skin: Negative for rash and wound.     Physical Exam Updated Vital Signs BP 127/99 (BP Location: Left Arm)   Pulse 100   Temp 98 F (36.7 C) (Oral)   Resp 16   Ht 5'  11" (1.803 m)   Wt 127 kg   SpO2 99%   BMI 39.05 kg/m   Physical Exam  Constitutional: He appears well-developed and well-nourished. No distress.  Eyes: Conjunctivae are normal.  Neck: Neck supple.  Cardiovascular: Normal rate.   Pulmonary/Chest: No respiratory distress.  Abdominal: He exhibits no distension.  Musculoskeletal: Normal range of motion.  Normal appearing right foot. ttp over 1st and 2nd MTP joints over right plantar surface of the foot. ttp over 2nd MTP joint over the dorsal foot. Full rom of all toes. Pt has difficulty and pain with extension of the 2nd toe. DP pulses intact. Cap refill <2sec distally.   Skin: Skin is warm and dry.  Nursing note and vitals  reviewed.    Dalton Treatments / Results  DIAGNOSTIC STUDIES: Oxygen Saturation is 99% on RA, normal by my interpretation.    COORDINATION OF CARE: 6:01 PM- Pt advised of plan for treatment and pt agrees. Pt informed of his x-ray results. He will receive post-op shoes. He reports that he is a size 12 in tennis shoes.    Radiology Dg Foot Complete Right  Result Date: 07/15/2016 CLINICAL DATA:  Third metatarsal pain since yesterday. Stepped on an object. EXAM: RIGHT FOOT COMPLETE - 3+ VIEW COMPARISON:  03/24/2007 FINDINGS: The joint spaces are maintained. No acute fractures identified. No radiopaque foreign body. Calcaneal spurring changes are noted. IMPRESSION: No acute bony findings or radiopaque foreign body. Electronically Signed   By: Rudie MeyerP.  Gallerani M.D.   On: 07/15/2016 17:03    Procedures Procedures (including critical care time)  Medications Ordered in Dalton Medications - No data to display   Initial Impression / Assessment and Plan / Dalton Course  [name] Drakkar Medeiros, PA-C has reviewed the triage vital signs and the nursing notes.  Pertinent labs & imaging results that were available during my care of the patient were reviewed by me and considered in my medical decision making (see chart for details).  Clinical Course   Pt with right foot pain after stepping on a wooden plank? Yesterday. Pain to the foot that is worse today. Xray negative. Question injury to the 2nd toe extensor tendon. Plan to place in post op boot. Pt has crutches at home. Ice, elevate. Follow up with orthopedics. Pt has an orthopedics specialist through [facility].   Vitals:   07/15/16 1625 07/15/16 1627  BP: 127/99   Pulse: 100   Resp: 16   Temp: 98 F (36.7 C)   TempSrc: Oral   SpO2: 99%   Weight:  127 kg  Height:  5\' 11"  (1.803 m)     Final Clinical Impressions(s) / Dalton Diagnoses   Final diagnoses:  Foot sprain, right, initial encounter    New Prescriptions Discharge Medication List as of 07/15/2016  6:09  PM       [name] [name], PA-C 07/15/16 2314    Tilden FossaElizabeth Rees, MD 07/17/16 1136

## 2016-09-23 ENCOUNTER — Emergency Department (HOSPITAL_COMMUNITY): Payer: Non-veteran care

## 2016-09-23 ENCOUNTER — Emergency Department (HOSPITAL_COMMUNITY)
Admission: EM | Admit: 2016-09-23 | Discharge: 2016-09-23 | Disposition: A | Discharge status: Home / Self Care | Payer: Non-veteran care | Attending: Emergency Medicine | Admitting: Emergency Medicine

## 2016-09-23 ENCOUNTER — Encounter (HOSPITAL_COMMUNITY): Payer: Self-pay | Admitting: Emergency Medicine

## 2016-09-23 DIAGNOSIS — J069 Acute upper respiratory infection, unspecified: Secondary | ICD-10-CM | POA: Diagnosis not present

## 2016-09-23 DIAGNOSIS — Z79899 Other long term (current) drug therapy: Secondary | ICD-10-CM | POA: Diagnosis not present

## 2016-09-23 DIAGNOSIS — F17200 Nicotine dependence, unspecified, uncomplicated: Secondary | ICD-10-CM | POA: Diagnosis not present

## 2016-09-23 DIAGNOSIS — R05 Cough: Secondary | ICD-10-CM | POA: Diagnosis present

## 2016-09-23 DIAGNOSIS — B9789 Other viral agents as the cause of diseases classified elsewhere: Secondary | ICD-10-CM

## 2016-09-23 LAB — CBC WITH DIFFERENTIAL/PLATELET
Basophils Absolute: 0 10*3/uL (ref 0.0–0.1)
Basophils Relative: 0 %
Eosinophils Absolute: 0.1 10*3/uL (ref 0.0–0.7)
Eosinophils Relative: 1 %
HEMATOCRIT: 45.9 % (ref 39.0–52.0)
Hemoglobin: 15.8 g/dL (ref 13.0–17.0)
LYMPHS PCT: 35 %
Lymphs Abs: 3.3 10*3/uL (ref 0.7–4.0)
MCH: 32.3 pg (ref 26.0–34.0)
MCHC: 34.4 g/dL (ref 30.0–36.0)
MCV: 93.9 fL (ref 78.0–100.0)
MONO ABS: 0.7 10*3/uL (ref 0.1–1.0)
MONOS PCT: 7 %
NEUTROS ABS: 5.2 10*3/uL (ref 1.7–7.7)
Neutrophils Relative %: 57 %
Platelets: 249 10*3/uL (ref 150–400)
RBC: 4.89 MIL/uL (ref 4.22–5.81)
RDW: 12.8 % (ref 11.5–15.5)
WBC: 9.2 10*3/uL (ref 4.0–10.5)

## 2016-09-23 NOTE — ED Provider Notes (Signed)
[facility]-EMERGENCY DEPT Provider Note   CSN: [id number] Arrival date & time: 09/23/16  1340   By signing my name below, I, Teofilo PodMatthew P. Jamison, attest that this documentation has been prepared under the direction and in the presence of Felicie Mornavid Marge Vandermeulen, NP. Electronically Signed: Teofilo PodMatthew P. Jamison, ED Scribe. 09/23/2016. 2:45 PM.   History   Chief Complaint Chief Complaint  Patient presents with  . Cough  . URI   The history is provided by the patient. No language interpreter was used.  URI   This is a new problem. Episode onset: 2 weeks ago. The problem has not changed since onset.There has been no fever. Associated symptoms include congestion and cough. Pertinent negatives include no chest pain. Treatments tried: OTC medications. The treatment provided moderate relief.   HPI Comments:  Dalton [name] is a 40 y.o. male who presents to the Emergency Department complaining of multiple worsening congestion x 2 weeks. Pt complains of associated productive cough with white sputum. Pt states that his symptoms worsen when laying down and at night. Pt states that in the past month he has had an EKG and a stress test. Pt has taken multiple OTC medications with moderate relief. Pt denies fever, chest pain.   Past Medical History:  Diagnosis Date  . Headache(784.0)   . Seizures (HCC)    10 years ago  . SVT (supraventricular tachycardia) [facility](HCC)     Patient Active Problem List   Diagnosis Date Noted  . Diarrhea 08/17/2013  . Tobacco use disorder 08/17/2013  . Fever 08/15/2013  . Sepsis (HCC) 08/15/2013  . CAP (community acquired pneumonia) 08/15/2013    Past Surgical History:  Procedure Laterality Date  . ANKLE SURGERY     left with screws  . right knee     plate with screws       Home Medications    Prior to Admission medications   Medication Sig Start Date End Date Taking? Authorizing Provider  acetaminophen (TYLENOL) 500 MG tablet Take 1,000 mg by mouth every 6 (six) hours as  needed for mild pain or fever.     Historical Provider, MD  ibuprofen (ADVIL,MOTRIN) 600 MG tablet Take 1 tablet (600 mg total) by mouth every 6 (six) hours as needed for moderate pain. 11/12/15   Loren Raceravid Yelverton, MD  lidocaine (XYLOCAINE) 2 % solution Use as directed 15 mLs in the mouth or throat as needed for mouth pain (Gargle and swallow.). 06/19/16   Felicie Mornavid Jayshawn Colston, NP  methocarbamol (ROBAXIN) 500 MG tablet Take 2 tablets (1,000 mg total) by mouth every 8 (eight) hours as needed for muscle spasms. 11/12/15   Loren Raceravid Yelverton, MD  traMADol (ULTRAM) 50 MG tablet Take 1 tablet (50 mg total) by mouth every 6 (six) hours as needed for severe pain. 11/12/15   Loren Raceravid Yelverton, MD    Family History Family History  Problem Relation Age of Onset  . Diabetes Father     Social History Social History  Substance Use Topics  . Smoking status: Current Every Day Smoker    Packs/day: 1.50    Years: 16.00  . Smokeless tobacco: Never Used  . Alcohol use Yes     Comment: ocassioinally     Allergies   Patient has no known allergies.   Review of Systems Review of Systems  Constitutional: Negative for fever.  HENT: Positive for congestion.   Respiratory: Positive for cough.   Cardiovascular: Negative for chest pain.  All other systems reviewed and are negative.  Physical Exam Updated Vital Signs BP 120/92 (BP Location: Left Arm)   Pulse 100   Temp 97.8 F (36.6 C) (Oral)   Resp 17   SpO2 94%   Physical Exam  Constitutional: He appears well-developed and well-nourished. No distress.  HENT:  Head: Normocephalic and atraumatic.  Eyes: Conjunctivae are normal.  Cardiovascular: Normal rate.   Pulmonary/Chest: Effort normal.  Harsh expiratory sound on the left side  Abdominal: He exhibits no distension.  Neurological: He is alert.  Skin: Skin is warm and dry.  Psychiatric: He has a normal mood and affect.  Nursing note and vitals reviewed.    ED Treatments / Results  DIAGNOSTIC  STUDIES:  Oxygen Saturation is 94% on RA, normal by my interpretation.    COORDINATION OF CARE:  2:45 PM Discussed treatment plan with pt at bedside and pt agreed to plan.   Labs (all labs ordered are listed, but only abnormal results are displayed) Labs Reviewed  CBC WITH DIFFERENTIAL/PLATELET    EKG  EKG Interpretation None       Radiology Dg Chest 2 View  Result Date: 09/23/2016 CLINICAL DATA:  Productive cough for the past 2 weeks associated with shortness of breath and pleuritic chest pain. Current smoker. History of SVT. EXAM: CHEST  2 VIEW COMPARISON:  Chest x-ray of November 12, 2015 and PA and lateral chest x-ray of August 21, 2015. FINDINGS: The lungs are well-expanded. There is no focal infiltrate. The interstitial markings are mildly increased but this is chronic. There is thickening of the major fissure presumably on the right which is stable. There is no pleural effusion or pneumothorax. The heart and pulmonary vascularity are normal. The mediastinum is normal in width. The bony thorax exhibits no acute abnormality. IMPRESSION: No acute cardiopulmonary abnormality. Chronic underlying chronic bronchitic-smoking related changes. Electronically Signed   By: Robinette Esters  [place] M.D.   On: 09/23/2016 15:29    Procedures Procedures (including critical care time)  Medications Ordered in ED Medications - No data to display   Initial Impression / Assessment and Plan / ED Course  Pt symptoms consistent with URI. CXR negative for acute infiltrate. Pt will be discharged with symptomatic treatment.  Discussed return precautions.  Pt is hemodynamically stable & in NAD prior to discharge.  I have reviewed the triage vital signs and the nursing notes.  Pertinent labs & imaging results that were available during my care of the patient were reviewed by me and considered in my medical decision making (see chart for details).  Clinical Course       Final Clinical Impressions(s) / ED  Diagnoses   Final diagnoses:  Viral URI with cough    New Prescriptions Discharge Medication List as of 09/23/2016  3:50 PM     I personally performed the services described in this documentation, which was scribed in my presence. The recorded information has been reviewed and is accurate.     Felicie Mornavid Deloma Spindle, NP 09/23/16 1744    Canary Brimhristopher J Tegeler, MD 09/24/16 2121

## 2016-09-23 NOTE — ED Triage Notes (Signed)
Patient states that for 2 weeks been having productive cough with white phlegm and URi symptoms that have gotten better since taking OTC meds.  Patient's main c/o is when he lays down at night "my lungs fill up with fluid and I wake up choking".  Patient states that he was diagnosed before with really bad PNA and symptoms at night are concerning to him.

## 2016-09-23 NOTE — ED Notes (Addendum)
Pt not in room nor waiting room in order to review d/c instructions. NP notified.

## 2016-10-06 ENCOUNTER — Emergency Department (HOSPITAL_COMMUNITY)
Admission: EM | Admit: 2016-10-06 | Discharge: 2016-10-06 | Disposition: A | Discharge status: Home / Self Care | Payer: Non-veteran care | Attending: Emergency Medicine | Admitting: Emergency Medicine

## 2016-10-06 ENCOUNTER — Encounter (HOSPITAL_COMMUNITY): Payer: Self-pay | Admitting: Emergency Medicine

## 2016-10-06 DIAGNOSIS — F17200 Nicotine dependence, unspecified, uncomplicated: Secondary | ICD-10-CM | POA: Diagnosis not present

## 2016-10-06 DIAGNOSIS — I471 Supraventricular tachycardia: Secondary | ICD-10-CM | POA: Insufficient documentation

## 2016-10-06 DIAGNOSIS — R000 Tachycardia, unspecified: Secondary | ICD-10-CM | POA: Diagnosis present

## 2016-10-06 LAB — BASIC METABOLIC PANEL
ANION GAP: 11 (ref 5–15)
BUN: 15 mg/dL (ref 6–20)
CALCIUM: 9.4 mg/dL (ref 8.9–10.3)
CO2: 22 mmol/L (ref 22–32)
CREATININE: 1.25 mg/dL — AB (ref 0.61–1.24)
Chloride: 102 mmol/L (ref 101–111)
GLUCOSE: 157 mg/dL — AB (ref 65–99)
Potassium: 3.6 mmol/L (ref 3.5–5.1)
Sodium: 135 mmol/L (ref 135–145)

## 2016-10-06 LAB — CBC
HCT: 47.4 % (ref 39.0–52.0)
Hemoglobin: 16.6 g/dL (ref 13.0–17.0)
MCH: 32.3 pg (ref 26.0–34.0)
MCHC: 35 g/dL (ref 30.0–36.0)
MCV: 92.2 fL (ref 78.0–100.0)
PLATELETS: 243 10*3/uL (ref 150–400)
RBC: 5.14 MIL/uL (ref 4.22–5.81)
RDW: 13 % (ref 11.5–15.5)
WBC: 8.4 10*3/uL (ref 4.0–10.5)

## 2016-10-06 LAB — TROPONIN I

## 2016-10-06 NOTE — ED Notes (Signed)
istat trop not run in timely manner; Trop I ordered and to be run in main lab

## 2016-10-06 NOTE — ED Provider Notes (Signed)
[facility]-EMERGENCY DEPT Provider Note   CSN: [id number] Arrival date & time: 10/06/16  2020     History   Chief Complaint Chief Complaint  Patient presents with  . Tachycardia    HPI Dalton [name] is a 40 y.o. male.  HPI Patient is a 40 year old male with past medical history of SVT who presents after an episode of SVT. Patient reports he was home pending down to take a pizza out of the freezer when he experienced sudden onset of palpitations.  He had associated chest pain, shortness of breath, diaphoresis, dizziness. Reports symptoms were similar to prior episodes of SVT. Most recent episode was about 2 months ago. EMS was called today and found patient with in SVT with rates in the 200s. He received adenosine 2 per EMS and converted to sinus tachycardia rate in the 120s prior to arrival. On arrival to the emergency department patient reports that his symptoms have resolved. He feels back to normal. Denies palpitations, chest pain, or shortness of breath. Patient reports [facility] [place] and has been [facility] [place] for [place]. He is currently undergoing preoperative workup. Patient reports he has been under a lot of stress lately due to breaking up with his girlfriend. He has not been sleeping well and has not been eating well. He thinks this may contribute to his event today. Denies recent fever, chills, cough or other illness. Denies increased caffeine, OTC cold medicine or illicit substance use.  Past Medical History:  Diagnosis Date  . Headache(784.0)   . Seizures (HCC)    10 years ago  . SVT (supraventricular tachycardia) [facility](HCC)     Patient Active Problem List   Diagnosis Date Noted  . Diarrhea 08/17/2013  . Tobacco use disorder 08/17/2013  . Fever 08/15/2013  . Sepsis (HCC) 08/15/2013  . CAP (community acquired pneumonia) 08/15/2013    Past Surgical History:  Procedure Laterality Date  . ANKLE  SURGERY     left with screws  . right knee     plate with screws       Home Medications    Prior to Admission medications   Medication Sig Start Date End Date Taking? Authorizing Provider  acetaminophen (TYLENOL) 500 MG tablet Take 1,000 mg by mouth every 6 (six) hours as needed for mild pain or fever.    Yes Historical Provider, MD  albuterol (PROVENTIL HFA;VENTOLIN HFA) 108 (90 Base) MCG/ACT inhaler Inhale 1-2 puffs into the lungs every 6 (six) hours as needed for wheezing or shortness of breath.   Yes Historical Provider, MD  diazepam (VALIUM) 5 MG tablet Take 5 mg by mouth every 6 (six) hours as needed (tachycardia).   Yes Historical Provider, MD  HYDROcodone-acetaminophen (NORCO/VICODIN) 5-325 MG tablet Take 1 tablet by mouth every 6 (six) hours as needed for moderate pain.   Yes Historical Provider, MD  ibuprofen (ADVIL,MOTRIN) 600 MG tablet Take 1 tablet (600 mg total) by mouth every 6 (six) hours as needed for moderate pain. 11/12/15  Yes Loren Raceravid Yelverton, MD  lidocaine (XYLOCAINE) 2 % solution Use as directed 15 mLs in the mouth or throat as needed for mouth pain (Gargle and swallow.). 06/19/16  Yes Felicie Mornavid Madeleyn Schwimmer, NP  methocarbamol (ROBAXIN) 500 MG tablet Take 2 tablets (1,000 mg total) by mouth every 8 (eight) hours as needed for muscle spasms. 11/12/15  Yes Loren Raceravid Yelverton, MD  traMADol (ULTRAM) 50 MG tablet Take 1 tablet (  50 mg total) by mouth every 6 (six) hours as needed for severe pain. 11/12/15  Yes Loren Racer, MD    Family History Family History  Problem Relation Age of Onset  . Diabetes Father     Social History Social History  Substance Use Topics  . Smoking status: Current Every Day Smoker    Packs/day: 1.50    Years: 16.00  . Smokeless tobacco: Never Used  . Alcohol use Yes     Comment: ocassioinally     Allergies   Patient has no known allergies.   Review of Systems Review of Systems  Constitutional: Positive for diaphoresis. Negative for chills and  fever.  HENT: Negative for ear pain and sore throat.   Eyes: Negative for pain and visual disturbance.  Respiratory: Positive for shortness of breath. Negative for cough.   Cardiovascular: Positive for chest pain and palpitations.  Gastrointestinal: Negative for abdominal pain and vomiting.  Genitourinary: Negative for dysuria and hematuria.  Musculoskeletal: Negative for arthralgias and back pain.  Skin: Negative for color change and rash.  Neurological: Positive for dizziness. Negative for seizures and syncope.  All other systems reviewed and are negative.    Physical Exam Updated Vital Signs BP 124/76   Pulse 108   Temp 98.9 F (37.2 C) (Oral)   Resp 19   Ht 5\' 11"  (1.803 m)   Wt 131.5 kg   SpO2 98%   BMI 40.45 kg/m   Physical Exam  Constitutional: He appears well-developed and well-nourished.  HENT:  Head: Normocephalic and atraumatic.  Eyes: Conjunctivae are normal.  Neck: Neck supple.  Cardiovascular: Regular rhythm.  Tachycardia present.   No murmur heard. Tachycardic, rate 110s  Pulmonary/Chest: Effort normal and breath sounds normal. No respiratory distress.  Abdominal: Soft. There is no tenderness.  Musculoskeletal: He exhibits no edema.  Neurological: He is alert.  Skin: Skin is warm and dry.  Psychiatric: He has a normal mood and affect.  Nursing note and vitals reviewed.    ED Treatments / Results  Labs (all labs ordered are listed, but only abnormal results are displayed) Labs Reviewed  BASIC METABOLIC PANEL - Abnormal; Notable for the following:       Result Value   Glucose, Bld 157 (*)    Creatinine, Ser 1.25 (*)    All other components within normal limits  CBC  TROPONIN I  I-STAT TROPOININ, ED    EKG  EKG Interpretation  Date/Time:  Monday October 06 2016 20:25:54 EST Ventricular Rate:  118 PR Interval:    QRS Duration: 95 QT Interval:  314 QTC Calculation: 440 R Axis:   63 Text Interpretation:  Sinus tachycardia No significant  change since last tracing Confirmed by Ethelda Chick  MD, SAM [phone]) on 10/06/2016 8:29:30 PM       Radiology No results found.  Procedures Procedures (including critical care time)  Medications Ordered in ED Medications - No data to display   Initial Impression / Assessment and Plan / ED Course  I have reviewed the triage vital signs and the nursing notes.  Pertinent labs & imaging results that were available during my care of the patient were reviewed by me and considered in my medical decision making (see chart for details).  Clinical Course     Patient is a 40 year old male with past nuchal history of SVT who presents after an episode of SVT. He was successfully converted with adenosine x2 by EMS prior to arrival. On arrival patient is mildly tachycardic, but  asymptomatic. EKG shows sinus tachycardia out acute changes from prior. Basic labs obtained on arrival and are unremarkable other than mildly increased creatinine at 1.2. Patient reports he feels back to normal. Patient was observed in the ED for several hours and did not have recurrence of symptoms. He was discharged in stable condition. [facility] and is [facility] pre-operative testing with plans for ablation in the near future. Strict return precautions were given and patient reports understanding and agreement with plan.   Patient care is supervised by Dr. Ethelda ChickJacubowitz, ED attending  Final Clinical Impressions(s) / ED Diagnoses   Final diagnoses:  SVT (supraventricular tachycardia) [facility](HCC)    New Prescriptions Discharge Medication List as of 10/06/2016 10:45 PM       Isa RankinAnn B Afshin Chrystal, MD 10/07/16 0230    Doug SouSam Jacubowitz, MD 10/08/16 [phone]

## 2016-10-06 NOTE — ED Triage Notes (Addendum)
Pt presents from home with [facility] for CP, SOB, diaphoresis, and dizziness with SVT rate in 200s; EMS reports giving adenosine x2 doses enroute and conversion successful; rate now 120s; pt reports CP has subsided; [facility] [place] and is supposed to get ablation after pre-op workup; pt A+O at this time; ECG strips at bedside

## 2016-10-07 NOTE — ED Provider Notes (Signed)
She complained of anterior chest discomfort pressure-like onset 1 hour prior to coming here found by EMS to be in SVT rhythm. Treated by EMS with adenosine, 2 doses with resolution of symptoms. Patient is presently in no distress he has history of SVT. He has plans for cardiac [facility] for 11/06/2016   Doug SouSam Bedie Dominey, MD 10/07/16 [phone]

## 2016-11-21 ENCOUNTER — Emergency Department (HOSPITAL_COMMUNITY)
Admission: EM | Admit: 2016-11-21 | Discharge: 2016-11-21 | Disposition: A | Discharge status: Home / Self Care | Payer: Non-veteran care | Attending: Emergency Medicine | Admitting: Emergency Medicine

## 2016-11-21 ENCOUNTER — Encounter (HOSPITAL_COMMUNITY): Payer: Self-pay | Admitting: Emergency Medicine

## 2016-11-21 DIAGNOSIS — R1030 Lower abdominal pain, unspecified: Secondary | ICD-10-CM | POA: Diagnosis not present

## 2016-11-21 DIAGNOSIS — Z79899 Other long term (current) drug therapy: Secondary | ICD-10-CM | POA: Diagnosis not present

## 2016-11-21 DIAGNOSIS — Z5321 Procedure and treatment not carried out due to patient leaving prior to being seen by health care provider: Secondary | ICD-10-CM | POA: Diagnosis not present

## 2016-11-21 DIAGNOSIS — F17200 Nicotine dependence, unspecified, uncomplicated: Secondary | ICD-10-CM | POA: Insufficient documentation

## 2016-11-21 LAB — BASIC METABOLIC PANEL
Anion gap: 9 (ref 5–15)
BUN: 16 mg/dL (ref 6–20)
CO2: 24 mmol/L (ref 22–32)
Calcium: 9.6 mg/dL (ref 8.9–10.3)
Chloride: 105 mmol/L (ref 101–111)
Creatinine, Ser: 0.95 mg/dL (ref 0.61–1.24)
GFR calc Af Amer: >60 mL/min (ref >60)
GFR calc non Af Amer: >60 mL/min (ref >60)
Glucose, Bld: 119 mg/dL — ABNORMAL HIGH (ref 65–99)
Potassium: 3.8 mmol/L (ref 3.5–5.1)
Sodium: 138 mmol/L (ref 135–145)

## 2016-11-21 LAB — CBC
HCT: 47.6 % (ref 39.0–52.0)
Hemoglobin: 16.4 g/dL (ref 13.0–17.0)
MCH: 32.2 pg (ref 26.0–34.0)
MCHC: 34.5 g/dL (ref 30.0–36.0)
MCV: 93.3 fL (ref 78.0–100.0)
PLATELETS: 243 10*3/uL (ref 150–400)
RBC: 5.1 MIL/uL (ref 4.22–5.81)
RDW: 12.9 % (ref 11.5–15.5)
WBC: 10.3 10*3/uL (ref 4.0–10.5)

## 2016-11-21 LAB — I-STAT CG4 LACTIC ACID, ED: Lactic Acid, Venous: 1.54 mmol/L (ref 0.5–1.9)

## 2016-11-21 NOTE — ED Notes (Signed)
Called x 3 no answer 

## 2016-11-21 NOTE — ED Triage Notes (Signed)
Pt presents to ED for assessment of redness, swelling, pain, with clear discharge from the access site in his groin from an ablation on Monday.  Pt denies any CP or SOB.  Pt spoke to MD who performed procedure and encouraged to come here.

## 2016-11-21 NOTE — ED Notes (Signed)
Patient spoke to RN.  Stated he was going to go home and "try again tomorrow".  Pt sts he is not concerned.  This RN explained reasons to be concerned and discussed return precautions.  [facility] [place].
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
## 2018-01-10 ENCOUNTER — Other Ambulatory Visit: Payer: Self-pay

## 2018-01-10 ENCOUNTER — Emergency Department (HOSPITAL_COMMUNITY): Payer: Non-veteran care

## 2018-01-10 ENCOUNTER — Emergency Department (HOSPITAL_COMMUNITY)
Admission: EM | Admit: 2018-01-10 | Discharge: 2018-01-10 | Disposition: A | Discharge status: Home / Self Care | Payer: Non-veteran care | Attending: Emergency Medicine | Admitting: Emergency Medicine

## 2018-01-10 ENCOUNTER — Encounter (HOSPITAL_COMMUNITY): Payer: Self-pay | Admitting: *Deleted

## 2018-01-10 DIAGNOSIS — Y929 Unspecified place or not applicable: Secondary | ICD-10-CM | POA: Diagnosis not present

## 2018-01-10 DIAGNOSIS — Z79899 Other long term (current) drug therapy: Secondary | ICD-10-CM | POA: Diagnosis not present

## 2018-01-10 DIAGNOSIS — S0181XA Laceration without foreign body of other part of head, initial encounter: Secondary | ICD-10-CM | POA: Insufficient documentation

## 2018-01-10 DIAGNOSIS — Y999 Unspecified external cause status: Secondary | ICD-10-CM | POA: Insufficient documentation

## 2018-01-10 DIAGNOSIS — G501 Atypical facial pain: Secondary | ICD-10-CM | POA: Diagnosis not present

## 2018-01-10 DIAGNOSIS — S0083XA Contusion of other part of head, initial encounter: Secondary | ICD-10-CM

## 2018-01-10 DIAGNOSIS — F17210 Nicotine dependence, cigarettes, uncomplicated: Secondary | ICD-10-CM | POA: Diagnosis not present

## 2018-01-10 DIAGNOSIS — Y939 Activity, unspecified: Secondary | ICD-10-CM | POA: Insufficient documentation

## 2018-01-10 DIAGNOSIS — H539 Unspecified visual disturbance: Secondary | ICD-10-CM | POA: Insufficient documentation

## 2018-01-10 DIAGNOSIS — S0993XA Unspecified injury of face, initial encounter: Secondary | ICD-10-CM | POA: Diagnosis present

## 2018-01-10 DIAGNOSIS — H1131 Conjunctival hemorrhage, right eye: Secondary | ICD-10-CM

## 2018-01-10 MED ORDER — TETANUS-DIPHTH-ACELL PERTUSSIS 5-2.5-18.5 LF-MCG/0.5 IM SUSP: 0.5000 mL | Freq: Once | INTRAMUSCULAR | Status: DC

## 2018-01-10 MED ORDER — HYDROCODONE-ACETAMINOPHEN 5-325 MG PO TABS
2.0000 | ORAL_TABLET | Freq: Once | ORAL | Status: AC
  Administered 2018-01-10: 2 via ORAL
  Filled 2018-01-10: qty 2

## 2018-01-10 MED ORDER — FLUORESCEIN SODIUM 1 MG OP STRP
1.0000 | ORAL_STRIP | Freq: Once | OPHTHALMIC | Status: AC
  Administered 2018-01-10: 1 via OPHTHALMIC
  Filled 2018-01-10: qty 1

## 2018-01-10 MED ORDER — TETRACAINE HCL 0.5 % OP SOLN
2.0000 [drp] | Freq: Once | OPHTHALMIC | Status: AC
  Administered 2018-01-10: 2 [drp] via OPHTHALMIC
  Filled 2018-01-10: qty 4

## 2018-01-10 MED ORDER — LIDOCAINE-EPINEPHRINE 1 %-1:100000 IJ SOLN
10.0000 mL | Freq: Once | INTRAMUSCULAR | Status: AC
  Administered 2018-01-10: 10 mL via INTRADERMAL
  Filled 2018-01-10: qty 10

## 2018-01-10 NOTE — ED Notes (Signed)
Wounds cleaned with soap and water.  
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
## 2018-01-10 NOTE — ED Notes (Signed)
Ice pack given for face. 

## 2018-01-10 NOTE — ED Notes (Signed)
gpd talking with pt
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
## 2018-01-11 ENCOUNTER — Ambulatory Visit (INDEPENDENT_AMBULATORY_CARE_PROVIDER_SITE_OTHER): Payer: Non-veteran care | Admitting: Ophthalmology

## 2018-01-11 ENCOUNTER — Encounter (INDEPENDENT_AMBULATORY_CARE_PROVIDER_SITE_OTHER): Payer: Self-pay | Admitting: Ophthalmology

## 2018-01-11 DIAGNOSIS — S058X1A Other injuries of right eye and orbit, initial encounter: Secondary | ICD-10-CM

## 2018-01-11 DIAGNOSIS — H3581 Retinal edema: Secondary | ICD-10-CM

## 2018-01-11 DIAGNOSIS — S0591XA Unspecified injury of right eye and orbit, initial encounter: Secondary | ICD-10-CM

## 2018-01-11 DIAGNOSIS — H1131 Conjunctival hemorrhage, right eye: Secondary | ICD-10-CM

## 2018-01-11 DIAGNOSIS — S0592XA Unspecified injury of left eye and orbit, initial encounter: Secondary | ICD-10-CM
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
## 2018-01-15 ENCOUNTER — Encounter (INDEPENDENT_AMBULATORY_CARE_PROVIDER_SITE_OTHER): Payer: Non-veteran care | Admitting: Ophthalmology

## 2018-01-15 ENCOUNTER — Telehealth (INDEPENDENT_AMBULATORY_CARE_PROVIDER_SITE_OTHER): Payer: Self-pay | Admitting: Ophthalmology

## 2018-01-15 NOTE — Telephone Encounter (Signed)
Pt called back an has been r/s
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
## 2018-01-18 ENCOUNTER — Ambulatory Visit (INDEPENDENT_AMBULATORY_CARE_PROVIDER_SITE_OTHER): Payer: Non-veteran care | Admitting: Ophthalmology

## 2018-01-18 ENCOUNTER — Encounter (INDEPENDENT_AMBULATORY_CARE_PROVIDER_SITE_OTHER): Payer: Self-pay | Admitting: Ophthalmology

## 2018-01-18 DIAGNOSIS — H35051 Retinal neovascularization, unspecified, right eye: Secondary | ICD-10-CM

## 2018-01-18 DIAGNOSIS — S0591XA Unspecified injury of right eye and orbit, initial encounter: Secondary | ICD-10-CM

## 2018-01-18 DIAGNOSIS — H3581 Retinal edema: Secondary | ICD-10-CM

## 2018-01-18 DIAGNOSIS — S058X1A Other injuries of right eye and orbit, initial encounter: Secondary | ICD-10-CM

## 2018-01-18 DIAGNOSIS — S0592XA Unspecified injury of left eye and orbit, initial encounter: Secondary | ICD-10-CM

## 2018-01-18 DIAGNOSIS — H1131 Conjunctival hemorrhage, right eye: Secondary | ICD-10-CM
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
## 2018-02-11 ENCOUNTER — Encounter (INDEPENDENT_AMBULATORY_CARE_PROVIDER_SITE_OTHER): Payer: Non-veteran care | Admitting: Ophthalmology

## 2018-07-03 ENCOUNTER — Encounter (HOSPITAL_COMMUNITY): Payer: Self-pay | Admitting: Emergency Medicine

## 2018-07-03 ENCOUNTER — Emergency Department (HOSPITAL_COMMUNITY)
Admission: EM | Admit: 2018-07-03 | Discharge: 2018-07-03 | Disposition: A | Discharge status: Home / Self Care | Payer: No Typology Code available for payment source | Attending: Emergency Medicine | Admitting: Emergency Medicine

## 2018-07-03 ENCOUNTER — Emergency Department (HOSPITAL_COMMUNITY): Payer: No Typology Code available for payment source

## 2018-07-03 DIAGNOSIS — S51822A Laceration with foreign body of left forearm, initial encounter: Secondary | ICD-10-CM | POA: Insufficient documentation

## 2018-07-03 DIAGNOSIS — Z23 Encounter for immunization: Secondary | ICD-10-CM | POA: Insufficient documentation

## 2018-07-03 DIAGNOSIS — F1290 Cannabis use, unspecified, uncomplicated: Secondary | ICD-10-CM | POA: Insufficient documentation

## 2018-07-03 DIAGNOSIS — Y990 Civilian activity done for income or pay: Secondary | ICD-10-CM | POA: Insufficient documentation

## 2018-07-03 DIAGNOSIS — F17210 Nicotine dependence, cigarettes, uncomplicated: Secondary | ICD-10-CM | POA: Insufficient documentation

## 2018-07-03 DIAGNOSIS — S41112A Laceration without foreign body of left upper arm, initial encounter: Secondary | ICD-10-CM

## 2018-07-03 DIAGNOSIS — W208XXA Other cause of strike by thrown, projected or falling object, initial encounter: Secondary | ICD-10-CM | POA: Insufficient documentation

## 2018-07-03 DIAGNOSIS — Y9389 Activity, other specified: Secondary | ICD-10-CM | POA: Insufficient documentation

## 2018-07-03 DIAGNOSIS — Y9289 Other specified places as the place of occurrence of the external cause: Secondary | ICD-10-CM | POA: Insufficient documentation

## 2018-07-03 MED ORDER — BACITRACIN ZINC 500 UNIT/GM EX OINT
TOPICAL_OINTMENT | CUTANEOUS | Status: AC
  Administered 2018-07-03: 1
  Filled 2018-07-03: qty 0.9

## 2018-07-03 MED ORDER — TETANUS-DIPHTH-ACELL PERTUSSIS 5-2.5-18.5 LF-MCG/0.5 IM SUSP
0.5000 mL | Freq: Once | INTRAMUSCULAR | Status: AC
  Administered 2018-07-03: 0.5 mL via INTRAMUSCULAR
  Filled 2018-07-03: qty 0.5

## 2018-07-03 MED ORDER — LIDOCAINE-EPINEPHRINE (PF) 2 %-1:200000 IJ SOLN
20.0000 mL | Freq: Once | INTRAMUSCULAR | Status: AC
  Administered 2018-07-03: 20 mL
  Filled 2018-07-03: qty 20

## 2018-07-03 MED ORDER — OXYCODONE-ACETAMINOPHEN 5-325 MG PO TABS
1.0000 | ORAL_TABLET | Freq: Once | ORAL | Status: AC
  Administered 2018-07-03: 1 via ORAL
  Filled 2018-07-03: qty 1

## 2018-07-03 NOTE — ED Notes (Signed)
Sterile water used to clean wound for better visibity

## 2018-07-03 NOTE — ED Triage Notes (Signed)
Patient here from work with complaints of left arm laceration, bleeding. Reports that a metal cutter went inside his arm. Does not know when last tetanus was.

## 2018-07-03 NOTE — Discharge Instructions (Signed)
Return for suture removal in 10 to 12 days. Return to ED sooner for any worsening symptoms including signs of infection like drainage from the area, redness surrounding the area, redness extending up to your arm, fevers or worsening pain.

## 2018-07-03 NOTE — ED Notes (Signed)
RN Hardie LoraLilibeth B moved suture cart to room and laceration tray to bedside table

## 2018-07-03 NOTE — ED Provider Notes (Signed)
Medical screening examination/treatment/procedure(s) were conducted as a shared visit with non-physician practitioner(s) and myself.  I personally evaluated the patient during the encounter.  None  Patient seen by me along with physician assistant.  Patient's tetanus updated today.  Patient was at work.  Had a Rinaldo RatelKadolph we will disc brace of disc shatter piece of it went into his left anterior forearm.  Patient with good radial pulse good cap refill good movement of his fingers.  Foreign body is visible on x-ray no bony injuries.  Also is palpable.  Measures about 6 cm.  Assisted the physician assistant with the procedure she was having difficulty getting the disc out.  Assisted her was able to get a good grip with the hemostats and rotate the disc out of the forearm wound.  Physician assistant will irrigate the wound extensively.  And closed loosely.  No evidence of any arterial or nerve injury.  Based on the location of the disc unlikely to cause any tendon injury.   Vanetta MuldersZackowski, Gatlyn Lipari, MD 07/03/18 2059

## 2018-07-03 NOTE — ED Provider Notes (Signed)
[facility]-EMERGENCY DEPT Provider Note   CSN: [id number] Arrival date & time: 07/03/18  1808     History   Chief Complaint Chief Complaint  Patient presents with  . Foreign Body in Skin  . Extremity Laceration    HPI Dalton [name] is a 42 y.o. male who presents to ED for evaluation of left forearm laceration that occurred approximately 1.5 hours ago.  He was at work using a metal cutter/cut off tool.  A piece of it broke off and flew into his left forearm.  He is unsure of last tetanus.  Denies any blood thinner use.  Has not taken any medicine to help with pain.  HPI  Past Medical History:  Diagnosis Date  . Headache(784.0)   . Seizures (HCC)    10 years ago  . SVT (supraventricular tachycardia) [facility](HCC)     Patient Active Problem List   Diagnosis Date Noted  . Diarrhea 08/17/2013  . Tobacco use disorder 08/17/2013  . Fever 08/15/2013  . Sepsis (HCC) 08/15/2013  . CAP (community acquired pneumonia) 08/15/2013    Past Surgical History:  Procedure Laterality Date  . ANKLE SURGERY     left with screws  . right knee     plate with screws        Home Medications    Prior to Admission medications   Medication Sig Start Date End Date Taking? Authorizing Provider  acetaminophen (TYLENOL) 500 MG tablet Take 1,000 mg by mouth every 6 (six) hours as needed for mild pain or fever.     [provider]  albuterol (PROVENTIL HFA;VENTOLIN HFA) 108 (90 Base) MCG/ACT inhaler Inhale 1-2 puffs into the lungs every 6 (six) hours as needed for wheezing or shortness of breath.    [provider]  diazepam (VALIUM) 5 MG tablet Take 5 mg by mouth every 6 (six) hours as needed (tachycardia).    [provider]  HYDROcodone-acetaminophen (NORCO/VICODIN) 5-325 MG tablet Take 1 tablet by mouth every 6 (six) hours as needed for moderate pain.    [provider]  ibuprofen (ADVIL,MOTRIN) 600 MG tablet Take 1 tablet (600 mg total) by  mouth every 6 (six) hours as needed for moderate pain. 11/12/15   Loren RacerYelverton, David, MD  lidocaine (XYLOCAINE) 2 % solution Use as directed 15 mLs in the mouth or throat as needed for mouth pain (Gargle and swallow.). 06/19/16   Felicie MornSmith, David, NP  methocarbamol (ROBAXIN) 500 MG tablet Take 2 tablets (1,000 mg total) by mouth every 8 (eight) hours as needed for muscle spasms. Patient not taking: Reported on 01/11/2018 11/12/15   Loren RacerYelverton, David, MD  traMADol (ULTRAM) 50 MG tablet Take 1 tablet (50 mg total) by mouth every 6 (six) hours as needed for severe pain. Patient not taking: Reported on 01/11/2018 11/12/15   Loren RacerYelverton, David, MD    Family History Family History  Problem Relation Age of Onset  . Diabetes Father     Social History Social History   Tobacco Use  . Smoking status: Current Every Day Smoker    Packs/day: 1.00    Years: 16.00    Pack years: 16.00  . Smokeless tobacco: Never Used  Substance Use Topics  . Alcohol use: Yes    Comment: ocassioinally  . Drug use: Yes    Types: Marijuana    Comment: ocassionally     Allergies   Patient has no known allergies.   Review of Systems Review of Systems  Constitutional: Negative for  appetite change, chills and fever.  HENT: Negative for ear pain, rhinorrhea, sneezing and sore throat.   Eyes: Negative for photophobia and visual disturbance.  Respiratory: Negative for cough, chest tightness, shortness of breath and wheezing.   Cardiovascular: Negative for chest pain and palpitations.  Gastrointestinal: Negative for abdominal pain, blood in stool, constipation, diarrhea, nausea and vomiting.  Genitourinary: Negative for dysuria, hematuria and urgency.  Musculoskeletal: Negative for myalgias.  Skin: Positive for wound. Negative for rash.  Neurological: Negative for dizziness, weakness and light-headedness.     Physical Exam Updated Vital Signs BP 119/74   Pulse 85   Temp 98.2 F (36.8 C) (Oral)   Resp 18   SpO2 97%    Physical Exam  Constitutional: He appears well-developed and well-nourished. No distress.  HENT:  Head: Normocephalic and atraumatic.  Nose: Nose normal.  Eyes: Conjunctivae and EOM are normal. Left eye exhibits no discharge. No scleral icterus.  Neck: Normal range of motion. Neck supple.  Cardiovascular: Normal rate, regular rhythm, normal heart sounds and intact distal pulses. Exam reveals no gallop and no friction rub.  No murmur heard. Pulmonary/Chest: Effort normal and breath sounds normal. No respiratory distress.  Abdominal: Soft. Bowel sounds are normal. He exhibits no distension. There is no tenderness. There is no guarding.  Musculoskeletal: Normal range of motion. He exhibits no edema.  Neurological: He is alert. He exhibits normal muscle tone. Coordination normal.  Skin: Skin is warm and dry. No rash noted.  6cm linear laceration noted to L forearm. Football shaped foreign body palpated. 2+ radial pulse palpated. Normal sensation to light touch distal to injury.  Psychiatric: He has a normal mood and affect.  Nursing note and vitals reviewed.    ED Treatments / Results  Labs (all labs ordered are listed, but only abnormal results are displayed) Labs Reviewed - No data to display  EKG None  Radiology Dg Forearm Left  Result Date: 07/03/2018 CLINICAL DATA:  Laceration EXAM: LEFT FOREARM - 2 VIEW COMPARISON:  None. FINDINGS: No fracture or malalignment. 6 cm foreign body within the soft tissues of the proximal forearm. IMPRESSION: 6 cm foreign body within the soft tissues of the proximal forearm. No acute osseous abnormality Electronically Signed   By: Jasmine Pang M.D.   On: 07/03/2018 19:05    Procedures .Foreign Body Removal Date/Time: 07/03/2018 9:20 PM Performed by: Vanetta Mulders, MD Authorized by: Dietrich Pates, PA-C  Consent: Verbal consent obtained. Consent given by: patient Patient understanding: patient states understanding of the procedure being  performed Patient consent: the patient's understanding of the procedure matches consent given Patient identity confirmed: verbally with patient Body area: skin General location: upper extremity Location details: left forearm Anesthesia: local infiltration  Anesthesia: Local Anesthetic: lidocaine 2% with epinephrine  Sedation: Patient sedated: no  1 objects recovered. Post-procedure assessment: foreign body removed Patient tolerance: Patient tolerated the procedure well with no immediate complications .Marland KitchenLaceration Repair Date/Time: 07/03/2018 9:21 PM Performed by: Dietrich Pates, PA-C Authorized by: Dietrich Pates, PA-C   Consent:    Consent obtained:  Verbal   Consent given by:  Patient   Risks discussed:  Infection, pain, poor cosmetic result, poor wound healing, retained foreign body, tendon damage, vascular damage, nerve damage and need for additional repair Anesthesia (see MAR for exact dosages):    Anesthesia method:  Local infiltration   Local anesthetic:  Lidocaine 2% WITH epi Laceration details:    Location:  Shoulder/arm   Shoulder/arm location:  L lower arm  Length (cm):  6 Pre-procedure details:    Preparation:  Patient was prepped and draped in usual sterile fashion Treatment:    Area cleansed with:  Saline   Amount of cleaning:  Extensive   Irrigation solution:  Sterile saline   Irrigation method:  Pressure wash Skin repair:    Repair method:  Sutures   Suture size:  4-0   Suture material:  Prolene   Suture technique:  Simple interrupted   Number of sutures:  5 Approximation:    Approximation:  Close Post-procedure details:    Dressing:  Antibiotic ointment   Patient tolerance of procedure:  Tolerated well, no immediate complications   (including critical care time)  Medications Ordered in ED Medications  lidocaine-EPINEPHrine (XYLOCAINE W/EPI) 2 %-1:200000 (PF) injection 20 mL (has no administration in time range)  oxyCODONE-acetaminophen  (PERCOCET/ROXICET) 5-325 MG per tablet 1 tablet (1 tablet Oral Given 07/03/18 1941)  Tdap (BOOSTRIX) injection 0.5 mL (0.5 mLs Intramuscular Given 07/03/18 1942)     Initial Impression / Assessment and Plan / ED Course  I have reviewed the triage vital signs and the nursing notes.  Pertinent labs & imaging results that were available during my care of the patient were reviewed by me and considered in my medical decision making (see chart for details).     42 year old male presents to ED for evaluation of foreign body and laceration on left forearm.  He was using a cut off tool at work when a piece of it broke off and flew onto his left forearm.  He is unsure of last tetanus.  X-ray shows 6 cm football shaped foreign body in the left forearm.  Dr. Deretha Emory was able to remove the object.  Extensive irrigation was done prior to suturing the laceration.  Patient counseled on wound care. Patient counseled on need to return or see PCP/urgent care for suture removal in 10-12 days. Patient was urged to return to the Emergency Department urgently with worsening pain, swelling, expanding erythema especially if it streaks away from the affected area, fever, or if they have any other concerns. Patient verbalized understanding.   Portions of this note were generated with Scientist, clinical (histocompatibility and immunogenetics). Dictation errors may occur despite best attempts at proofreading.  Final Clinical Impressions(s) / ED Diagnoses   Final diagnoses:  Laceration of left upper extremity, initial encounter    ED Discharge Orders    None       Dietrich Pates, PA-C 07/03/18 2123    Vanetta Mulders, MD 07/11/18 [id number]

## 2019-10-28 ENCOUNTER — Encounter (HOSPITAL_COMMUNITY): Payer: Self-pay | Admitting: Emergency Medicine

## 2019-10-28 ENCOUNTER — Other Ambulatory Visit: Payer: Self-pay

## 2019-10-28 ENCOUNTER — Emergency Department (HOSPITAL_COMMUNITY)
Admission: EM | Admit: 2019-10-28 | Discharge: 2019-10-28 | Disposition: A | Discharge status: Home / Self Care | Payer: No Typology Code available for payment source | Attending: Emergency Medicine | Admitting: Emergency Medicine

## 2019-10-28 DIAGNOSIS — R112 Nausea with vomiting, unspecified: Secondary | ICD-10-CM | POA: Insufficient documentation

## 2019-10-28 DIAGNOSIS — Z5321 Procedure and treatment not carried out due to patient leaving prior to being seen by health care provider: Secondary | ICD-10-CM | POA: Insufficient documentation

## 2019-10-28 HISTORY — DX: Essential (primary) hypertension: I10

## 2019-10-28 LAB — CBC
HCT: 49.1 % (ref 39.0–52.0)
Hemoglobin: 16.3 g/dL (ref 13.0–17.0)
MCH: 31.5 pg (ref 26.0–34.0)
MCHC: 33.2 g/dL (ref 30.0–36.0)
MCV: 95 fL (ref 80.0–100.0)
Platelets: 256 10*3/uL (ref 150–400)
RBC: 5.17 MIL/uL (ref 4.22–5.81)
RDW: 12.9 % (ref 11.5–15.5)
WBC: 6.2 10*3/uL (ref 4.0–10.5)
nRBC: 0 % (ref 0.0–0.2)

## 2019-10-28 LAB — COMPREHENSIVE METABOLIC PANEL
ALT: 17 U/L (ref 0–44)
AST: 20 U/L (ref 15–41)
Albumin: 3.8 g/dL (ref 3.5–5.0)
Alkaline Phosphatase: 55 U/L (ref 38–126)
Anion gap: 11 (ref 5–15)
BUN: 20 mg/dL (ref 6–20)
CO2: 25 mmol/L (ref 22–32)
Calcium: 9.3 mg/dL (ref 8.9–10.3)
Chloride: 102 mmol/L (ref 98–111)
Creatinine, Ser: 1.18 mg/dL (ref 0.61–1.24)
GFR calc Af Amer: >60 mL/min (ref >60)
GFR calc non Af Amer: >60 mL/min (ref >60)
Glucose, Bld: 169 mg/dL — ABNORMAL HIGH (ref 70–99)
Potassium: 4 mmol/L (ref 3.5–5.1)
Sodium: 138 mmol/L (ref 135–145)
Total Bilirubin: 0.7 mg/dL (ref 0.3–1.2)
Total Protein: 7.6 g/dL (ref 6.5–8.1)

## 2019-10-28 LAB — LIPASE, BLOOD: Lipase: 23 U/L (ref 11–51)

## 2019-10-28 NOTE — ED Notes (Signed)
No answer from lobby  

## 2019-10-28 NOTE — ED Notes (Signed)
Patient called for room placement x1 with no answer. 

## 2019-10-28 NOTE — ED Triage Notes (Signed)
Pt c/o abd pains with n/v/d for 4 days and feeling better and able to eat foods.  Reports was exposed to friend with Covid. Needs to be tested and work note so can go to work.

## 2019-10-28 NOTE — ED Notes (Signed)
Patient called x2 with answer.

## 2020-07-11 DEATH — deceased
